# Patient Record
Sex: Male | Born: 1960
Health system: Southern US, Community
[De-identification: ages and names within clinical notes are randomized; demographics above are authoritative.]

## PROBLEM LIST (undated history)

## (undated) DIAGNOSIS — G4730 Sleep apnea, unspecified: Secondary | ICD-10-CM

## (undated) DIAGNOSIS — I10 Essential (primary) hypertension: Secondary | ICD-10-CM

## (undated) DIAGNOSIS — T7840XA Allergy, unspecified, initial encounter: Secondary | ICD-10-CM

## (undated) DIAGNOSIS — R030 Elevated blood-pressure reading, without diagnosis of hypertension: Secondary | ICD-10-CM

## (undated) HISTORY — DX: Elevated blood-pressure reading, without diagnosis of hypertension: R03.0

## (undated) HISTORY — DX: Essential (primary) hypertension: I10

## (undated) HISTORY — DX: Sleep apnea, unspecified: G47.30

## (undated) HISTORY — DX: Allergy, unspecified, initial encounter: T78.40XA

## (undated) HISTORY — PX: COLONOSCOPY: SHX174

---

## 2009-04-06 ENCOUNTER — Encounter: Admission: RE | Admit: 2009-04-06 | Discharge: 2009-04-06 | Payer: Self-pay | Admitting: Family Medicine

## 2009-05-11 ENCOUNTER — Ambulatory Visit: Payer: Self-pay

## 2009-05-16 ENCOUNTER — Encounter (INDEPENDENT_AMBULATORY_CARE_PROVIDER_SITE_OTHER): Payer: Self-pay | Admitting: Radiology

## 2009-05-24 ENCOUNTER — Ambulatory Visit (HOSPITAL_BASED_OUTPATIENT_CLINIC_OR_DEPARTMENT_OTHER): Admission: RE | Admit: 2009-05-24 | Discharge: 2009-05-24 | Payer: Self-pay | Admitting: Urology

## 2009-05-30 ENCOUNTER — Telehealth (INDEPENDENT_AMBULATORY_CARE_PROVIDER_SITE_OTHER): Payer: Self-pay | Admitting: *Deleted

## 2009-05-31 ENCOUNTER — Encounter (INDEPENDENT_AMBULATORY_CARE_PROVIDER_SITE_OTHER): Payer: Self-pay | Admitting: *Deleted

## 2009-05-31 ENCOUNTER — Ambulatory Visit: Payer: Self-pay | Admitting: Cardiovascular Disease

## 2009-05-31 ENCOUNTER — Ambulatory Visit (HOSPITAL_COMMUNITY): Admission: RE | Admit: 2009-05-31 | Discharge: 2009-05-31 | Payer: Self-pay | Admitting: Family Medicine

## 2009-05-31 ENCOUNTER — Ambulatory Visit: Payer: Self-pay

## 2009-05-31 ENCOUNTER — Encounter (HOSPITAL_COMMUNITY): Admission: RE | Admit: 2009-05-31 | Discharge: 2009-08-08 | Payer: Self-pay | Admitting: Family Medicine

## 2009-05-31 ENCOUNTER — Encounter: Payer: Self-pay | Admitting: Family Medicine

## 2010-06-04 HISTORY — PX: VASECTOMY: SHX75

## 2011-04-16 IMAGING — CT CT ABDOMEN W/O CM
2 of 4 series · 17 of 46 positions shown, 19 images · non-contrast
Comparison: None

CT ABDOMEN

CLINICAL DATA: Flank pain and hematuria

CT ABDOMEN AND PELVIS WITHOUT CONTRAST
TECHNIQUE: Multidetector CT imaging of the abdomen and pelvis was
performed following the standard protocol without intravenous
contrast.

[Series 6: wo · axial · 0.84mm/px · z∈[+5,+465]mm · 14 of 101 slices shown, 16 images]
[im 5/101  soft-tissue]
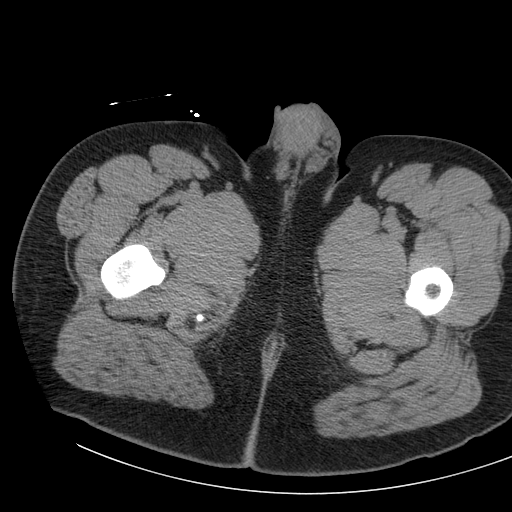
[im 5/101  bone]
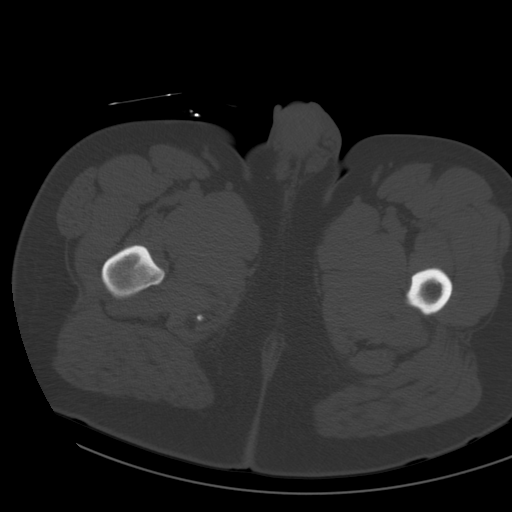
[im 13/101  soft-tissue]
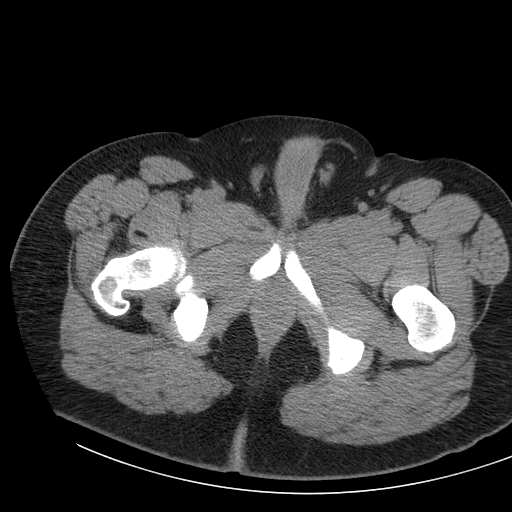
[im 21/101  soft-tissue]
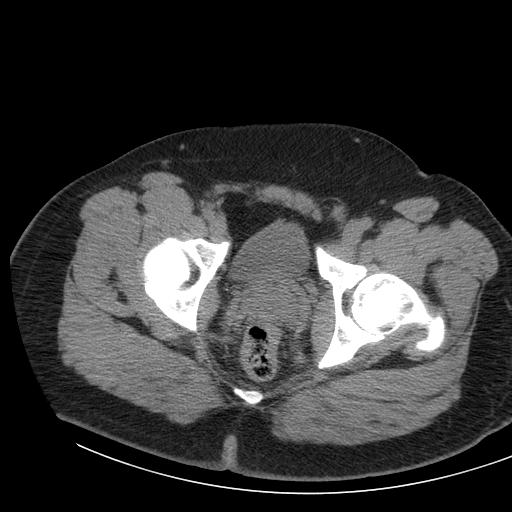
[im 29/101  soft-tissue]
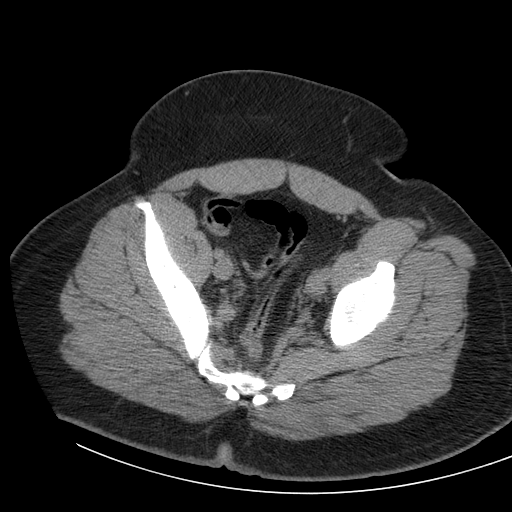
[im 33/101  soft-tissue]
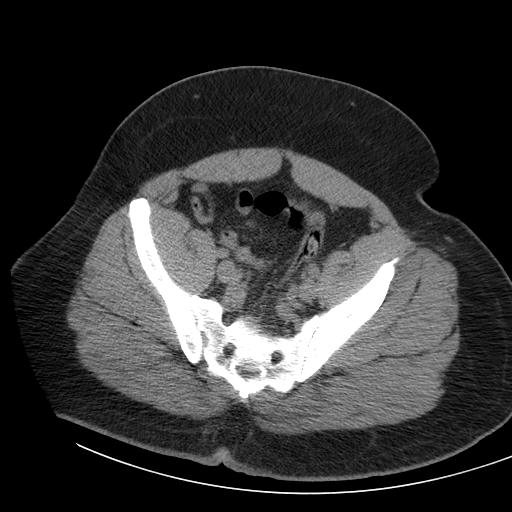
[im 41/101  soft-tissue]
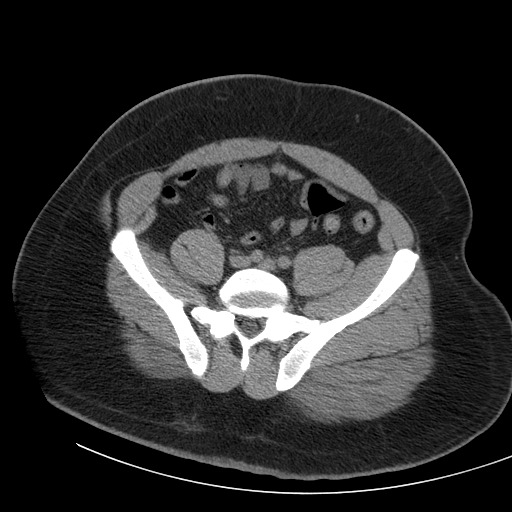
[im 49/101  soft-tissue]
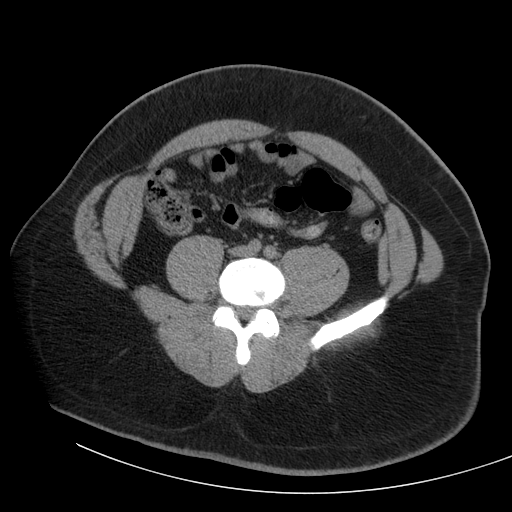
[im 53/101  soft-tissue]
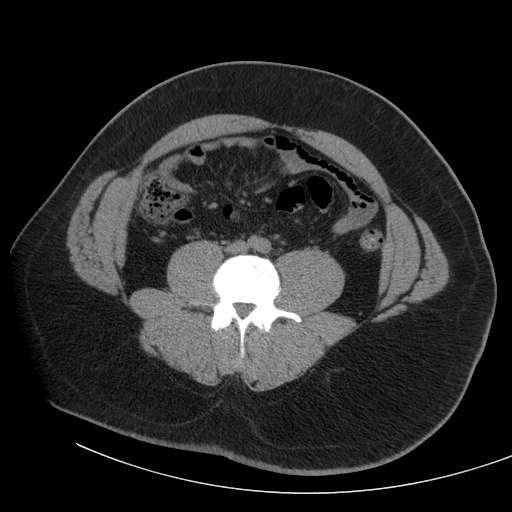
[im 61/101  soft-tissue]
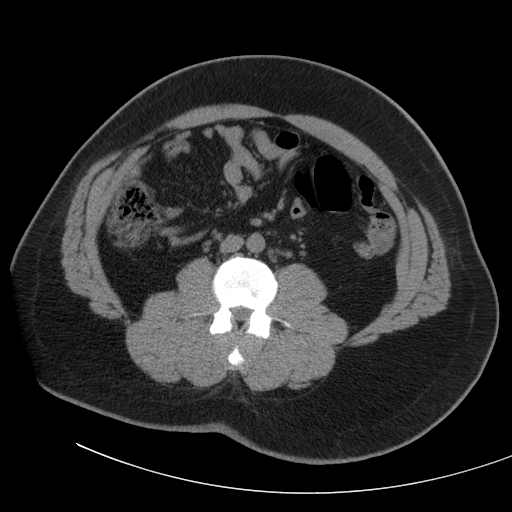
[im 61/101  bone]
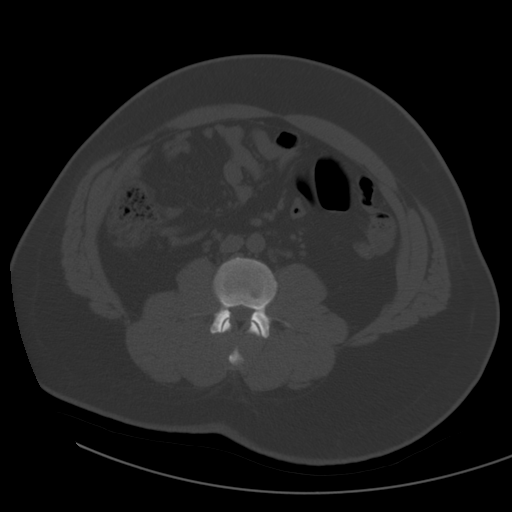
[im 69/101  soft-tissue]
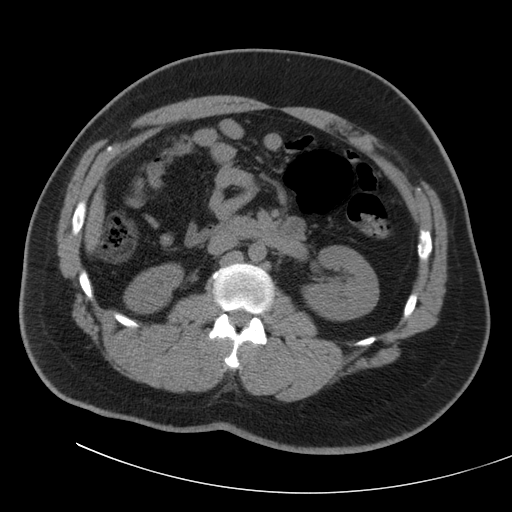
[im 77/101  soft-tissue]
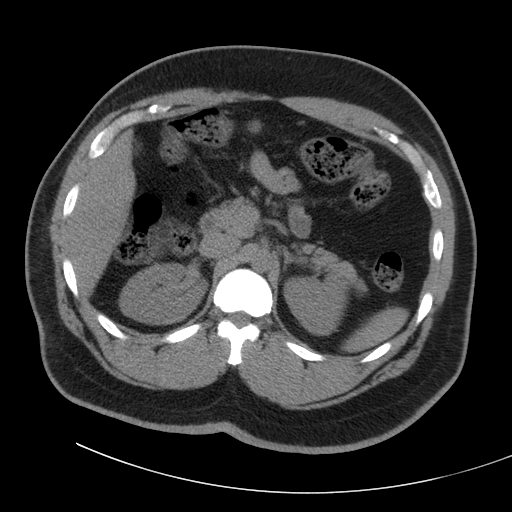
[im 81/101  soft-tissue]
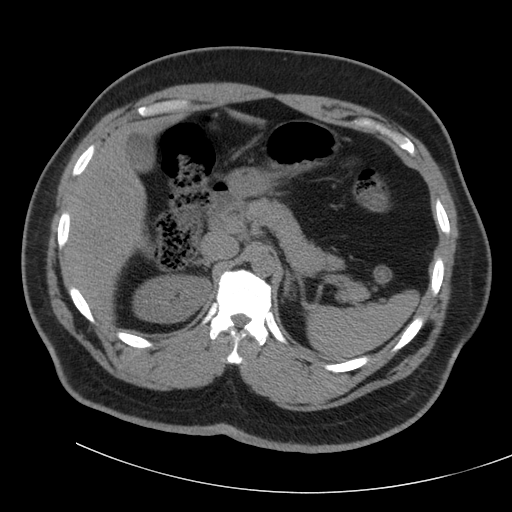
[im 89/101  soft-tissue]
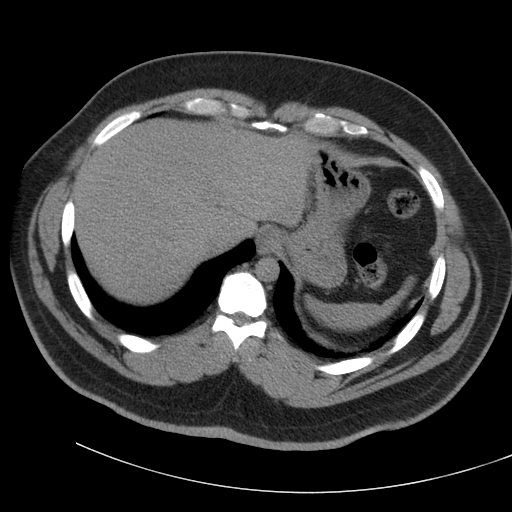
[im 97/101  soft-tissue]
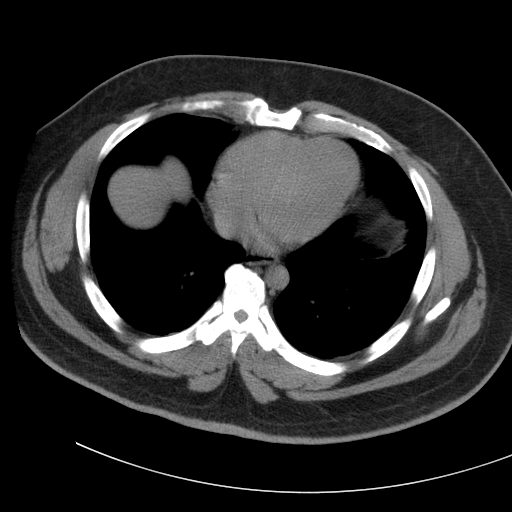

[coronals · coronal · 0.97mm/px · 3 of 116 slices shown]
[im 39/116  soft-tissue]
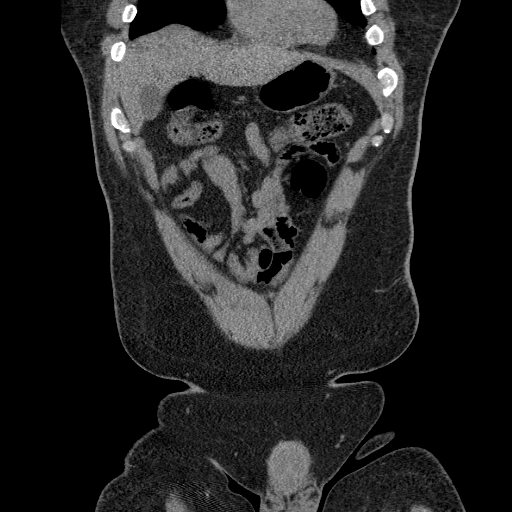
[im 52/116  soft-tissue]
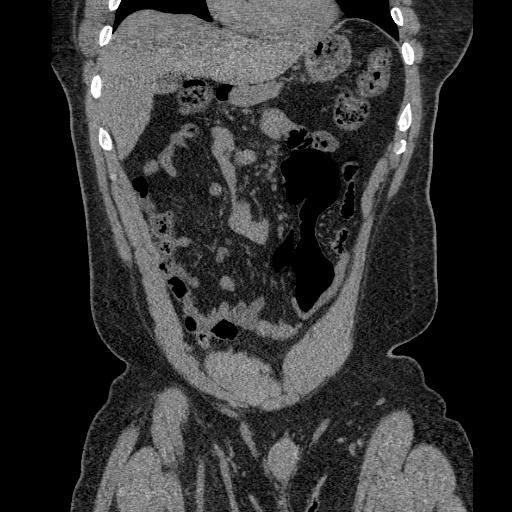
[im 64/116  soft-tissue]
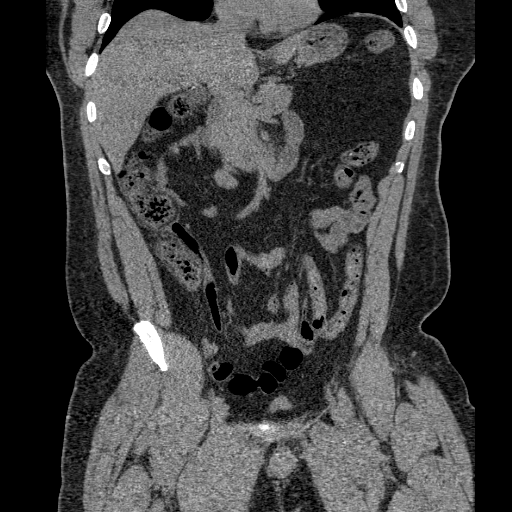

[17 of 46 positions shown; findings below may reference images not displayed]

FINDINGS: The lung bases are clear. The spleen is normal.

Both adrenal glands are normal.

The pancreas is normal.

There are several small stones versus sludge within the dependent
portion of the gallbladder.

The liver appears normal.

There is no free fluid or abnormal fluid collection.

The bowel loops of the upper abdomen have a normal course and
caliber without obstruction.

There is no evidence for nephrolithiasis or hydronephrosis.

No hydroureter identified.

No enlarged lymph nodes identified within the upper abdomen.

The bowel loops are normal in their course and caliber.

There are no free fluid or abnormal fluid collections.

Review of the visualized osseous structures shows mild spondylosis.
IMPRESSION: 1.  No acute upper abdominal CT findings.  No evidence for
nephrolithiasis or obstructive uropathy.

CT PELVIS
FINDINGS: Appendix identified and normal.

Visualized colon and small bowel are unremarkable.

No free fluid or abnormal fluid collections.

No significant lymphadenopathy.

Urinary bladder is normal.
IMPRESSION: 1.  No acute pelvic CT findings.
2.  No bladder calculi or distal ureteral calculi.

## 2011-05-09 ENCOUNTER — Ambulatory Visit (INDEPENDENT_AMBULATORY_CARE_PROVIDER_SITE_OTHER): Payer: 59

## 2011-05-09 DIAGNOSIS — Z0000 Encounter for general adult medical examination without abnormal findings: Secondary | ICD-10-CM

## 2011-05-09 DIAGNOSIS — R030 Elevated blood-pressure reading, without diagnosis of hypertension: Secondary | ICD-10-CM

## 2011-05-09 DIAGNOSIS — G4700 Insomnia, unspecified: Secondary | ICD-10-CM

## 2012-11-14 ENCOUNTER — Ambulatory Visit (INDEPENDENT_AMBULATORY_CARE_PROVIDER_SITE_OTHER): Payer: 59 | Admitting: Family Medicine

## 2012-11-14 ENCOUNTER — Encounter: Payer: Self-pay | Admitting: Family Medicine

## 2012-11-14 VITALS — BP 132/102 | HR 76 | Temp 98.3°F | Resp 16 | Ht 71.0 in | Wt 301.0 lb

## 2012-11-14 DIAGNOSIS — N400 Enlarged prostate without lower urinary tract symptoms: Secondary | ICD-10-CM

## 2012-11-14 DIAGNOSIS — I10 Essential (primary) hypertension: Secondary | ICD-10-CM

## 2012-11-14 DIAGNOSIS — G479 Sleep disorder, unspecified: Secondary | ICD-10-CM

## 2012-11-14 DIAGNOSIS — Z0000 Encounter for general adult medical examination without abnormal findings: Secondary | ICD-10-CM

## 2012-11-14 DIAGNOSIS — I159 Secondary hypertension, unspecified: Secondary | ICD-10-CM | POA: Insufficient documentation

## 2012-11-14 DIAGNOSIS — Z1211 Encounter for screening for malignant neoplasm of colon: Secondary | ICD-10-CM

## 2012-11-14 LAB — LIPID PANEL
Cholesterol: 154 mg/dL (ref 0–200)
HDL: 40 mg/dL (ref >39)
Total CHOL/HDL Ratio: 3.9 Ratio
Triglycerides: 76 mg/dL (ref <150)

## 2012-11-14 LAB — POCT URINALYSIS DIPSTICK
Protein, UA: NEGATIVE
Spec Grav, UA: >=1.03
Urobilinogen, UA: 1

## 2012-11-14 LAB — CBC WITH DIFFERENTIAL/PLATELET
Basophils Absolute: 0 10*3/uL (ref 0.0–0.1)
Basophils Relative: 0 % (ref 0–1)
Eosinophils Relative: 4 % (ref 0–5)
HCT: 49.1 % (ref 39.0–52.0)
MCH: 31.7 pg (ref 26.0–34.0)
MCHC: 34.8 g/dL (ref 30.0–36.0)
MCV: 90.9 fL (ref 78.0–100.0)
Monocytes Absolute: 0.5 10*3/uL (ref 0.1–1.0)
RDW: 12.9 % (ref 11.5–15.5)

## 2012-11-14 LAB — COMPREHENSIVE METABOLIC PANEL
AST: 18 U/L (ref 0–37)
BUN: 12 mg/dL (ref 6–23)
CO2: 24 mEq/L (ref 19–32)
Calcium: 9.7 mg/dL (ref 8.4–10.5)
Chloride: 103 mEq/L (ref 96–112)
Creat: 1.18 mg/dL (ref 0.50–1.35)

## 2012-11-14 LAB — IFOBT (OCCULT BLOOD): IFOBT: NEGATIVE

## 2012-11-14 MED ORDER — DOXAZOSIN MESYLATE 2 MG PO TABS: 2.0000 mg | ORAL_TABLET | Freq: Every day | ORAL | Status: DC

## 2012-11-14 NOTE — Progress Notes (Signed)
Subjective:    Patient ID: Steven Simpson, male    DOB: 07-26-1960, 52 y.o.   MRN: [id number]  HPI  This 52 y.o. AA male is here for CPE; last exam Dec 2012. Noted to have chronic mildly BP  but no history of treatment. He also has chronically swollen ankles; work-up has been negative.  Pt reports "heart racing" with diuretics. Cardiology Nuclear study at [facility] in  Dec 2010- negative.    Pt does report some sleep disturbance where he awakens feeling "claustrophobic" if he is  not positioned a certain way. He has been told by his wife that he snores. He denies daytime  sleepiness or excessive fatigue.   PMHx, Soc Hx and Fam Hx reviewed.   Review of Systems  Constitutional: Negative.   HENT: Negative.   Eyes: Negative.   Respiratory: Negative.   Cardiovascular: Positive for leg swelling.  Gastrointestinal: Negative.   Endocrine: Negative.   Genitourinary: Negative.   Musculoskeletal: Negative.   Skin: Negative.   Allergic/Immunologic: Negative.   Neurological: Negative.   Hematological: Negative.   Psychiatric/Behavioral: Negative.        Objective:   Physical Exam  Nursing note and vitals reviewed. Constitutional: He is oriented to person, place, and time. He appears well-developed and well-nourished. No distress.  HENT:  Head: Normocephalic and atraumatic.  Right Ear: Hearing, external ear and ear canal normal. Tympanic membrane is scarred.  Left Ear: Hearing, external ear and ear canal normal. Tympanic membrane is scarred.  Nose: Nose normal. No mucosal edema, nasal deformity or septal deviation.  Mouth/Throat: Uvula is midline, oropharynx is clear and moist and mucous membranes are normal. No oral lesions. Normal dentition. No dental caries.  Posterior pharynx- Mallampati Class III  Neck: Full passive range of motion without pain. Neck supple. No hepatojugular reflux and no JVD present. No spinous process tenderness and no muscular tenderness present.  Normal range of motion present. No mass and no thyromegaly present.  Neck circumference > normal.  Cardiovascular: Normal rate, regular rhythm, normal heart sounds and intact distal pulses.  Exam reveals no gallop and no friction rub.   No murmur heard. Pulmonary/Chest: Effort normal and breath sounds normal. No respiratory distress. He has no wheezes. He has no rales. He exhibits no tenderness.  Abdominal: Soft. Normal appearance and bowel sounds are normal. He exhibits no distension, no pulsatile midline mass and no mass. There is no hepatosplenomegaly. There is no tenderness. There is no guarding and no CVA tenderness. No hernia.  Genitourinary: Rectum normal. Rectal exam shows no external hemorrhoid, no fissure, no mass, no tenderness and anal tone normal. Guaiac negative stool. Prostate is enlarged. Prostate is not tender.  Musculoskeletal: Normal range of motion. He exhibits edema. He exhibits no tenderness.  Lymphadenopathy:       Right: No inguinal adenopathy present.       Left: No inguinal adenopathy present.  Neurological: He is alert and oriented to person, place, and time. He has normal strength. He is not disoriented. He displays no atrophy. No cranial nerve deficit or sensory deficit. He exhibits normal muscle tone. He displays a negative Romberg sign. Coordination and gait normal.  Reflex Scores:      Tricep reflexes are 1+ on the right side and 1+ on the left side.      Bicep reflexes are 1+ on the right side and 1+ on the left side.      Patellar reflexes are 1+ on the right side and 1+ on  the left side. Skin: Skin is warm and dry. No rash noted. No erythema.  Large sessile skin tag in mid back.  Psychiatric: He has a normal mood and affect. His behavior is normal. Judgment and thought content normal.      Results for orders placed in visit on 11/14/12  POCT URINALYSIS DIPSTICK      Result Value Range   Color, UA yellow     Clarity, UA clear     Glucose, UA neg      Bilirubin, UA neg     Ketones, UA neg     Spec Grav, UA >=1.030     Blood, UA moderate     pH, UA 6.0     Protein, UA neg     Urobilinogen, UA 1.0     Nitrite, UA neg     Leukocytes, UA Negative    IFOBT (OCCULT BLOOD)      Result Value Range   IFOBT Negative          Assessment & Plan:  Routine general medical examination at a health care facility - Plan: POCT urinalysis dipstick, IFOBT POC (occult bld, rslt in office), Comprehensive metabolic panel, Lipid panel, CBC with Differential, EKG 12-Lead  Unspecified essential hypertension - Plan: Vitamin D 25 hydroxy, Comprehensive metabolic panel, Lipid panel, EKG 12-Lead                                                            RX: Doxazosin 2 mg  Start with 1/2 tab hs for 3 nights then 1 tab hs.   Sleep disturbance - Plan: Ambulatory referral to Sleep Studies  Hypertrophy of prostate without urinary obstruction and other lower urinary tract symptoms (LUTS) -  Py has had eval at [facility]  ~ 3 years ago.   Plan: PSA  Severe obesity (BMI >= 40)- advised weight reduction with healthy eating and increased activity level.  Screening for colorectal cancer - Plan: Ambulatory referral to Gastroenterology  Meds ordered this encounter  Medications  . doxazosin (CARDURA) 2 MG tablet    Sig: Take 1 tablet (2 mg total) by mouth at bedtime.    Dispense:  30 tablet    Refill:  5

## 2012-11-14 NOTE — Patient Instructions (Signed)
Keeping you healthy  Get these tests  Blood pressure- Have your blood pressure checked once a year by your healthcare provider.  Normal blood pressure is 120/80  Weight- Have your body mass index (BMI) calculated to screen for obesity.  BMI is a measure of body fat based on height and weight. You can also calculate your own BMI at [email or web address].  Cholesterol- Have your cholesterol checked every year.  Diabetes- Have your blood sugar checked regularly if you have high blood pressure, high cholesterol, have a family history of diabetes or if you are overweight.  Screening for Colon Cancer- Colonoscopy starting at age 95.  Screening may begin sooner depending on your family history and other health conditions. Follow up colonoscopy as directed by your Gastroenterologist- referred to Dr. Elnoria Howard.  Screening for Prostate Cancer- Both blood work (PSA) and a rectal exam help screen for Prostate Cancer.  Screening begins at age 36 with African-American men and at age 35 with Caucasian men.  Screening may begin sooner depending on your family history. You still have blood showing up in your urine specimen. I will continue to monitor this.  Take these medicines  Aspirin- One aspirin daily can help prevent Heart disease and Stroke.  Flu shot- Every fall.  Tetanus- Every 10 years. Next Tetanus due in 2019.  Zostavax- Once after the age of 66 to prevent Shingles.  Pneumonia shot- Once after the age of 2; if you are younger than 2, ask your healthcare provider if you need a Pneumonia shot.  Take these steps  Don't smoke- If you do smoke, talk to your doctor about quitting.  For tips on how to quit, go to www.smokefree.gov or call 1-800-QUIT-NOW.  Be physically active- Exercise 5 days a week for at least 30 minutes.  If you are not already physically active start slow and gradually work up to 30 minutes of moderate physical activity.  Examples of moderate activity include walking briskly,  mowing the yard, dancing, swimming, bicycling, etc.  Eat a healthy diet- Eat a variety of healthy food such as fruits, vegetables, low fat milk, low fat cheese, yogurt, lean meant, poultry, fish, beans, tofu, etc. For more information go to www.thenutritionsource.org  Drink alcohol in moderation- Limit alcohol intake to less than two drinks a day. Never drink and drive.  Dentist- Brush and floss twice daily; visit your dentist twice a year.  Depression- Your emotional health is as important as your physical health. If you're feeling down, or losing interest in things you would normally enjoy please talk to your healthcare provider.  Eye exam- Visit your eye doctor every year.  Safe sex- If you may be exposed to a sexually transmitted infection, use a condom.  Seat belts- Seat belts can save your life; always wear one.  Smoke/Carbon Monoxide detectors- These detectors need to be installed on the appropriate level of your home.  Replace batteries at least once a year.  Skin cancer- When out in the sun, cover up and use sunscreen 15 SPF or higher.  Violence- If anyone is threatening you, please tell your healthcare provider.  Living Will/ Health care power of attorney- Speak with your healthcare provider and family.   You have Hypertension; I have prescribed a medication- "Cardura" is the brand but you will get a generic at your pharmacy. Start by taking 1/2 tablet at bedtime through the weekend then, starting Monday night, take 1 tablet at bedtime.  Hypertension Information As your heart beats, it forces blood through your  arteries. This force is your blood pressure. If the pressure is too high, it is called hypertension (HTN) or high blood pressure. HTN is dangerous because you may have it and not know it. High blood pressure may mean that your heart has to work harder to pump blood. Your arteries may be narrow or stiff. The extra work puts you at risk for heart disease, stroke, and other  problems.  Blood pressure consists of two numbers, a higher number over a lower, 110/72, for example. It is stated as "110 over 72." The ideal is below 120 for the top number (systolic) and under 80 for the bottom (diastolic).  You should pay close attention to your blood pressure if you have certain conditions such as:  Heart failure.   Prior heart attack.   Diabetes   Chronic kidney disease.   Prior stroke.   Multiple risk factors for heart disease.  To see if you have HTN, your blood pressure should be measured while you are seated with your arm held at the level of the heart. It should be measured at least twice. A one-time elevated blood pressure reading (especially in the Emergency Department) does not mean that you need treatment. There may be conditions in which the blood pressure is different between your right and left arms. It is important to see your caregiver soon for a recheck. Most people have essential hypertension which means that there is not a specific cause. This type of high blood pressure may be lowered by changing lifestyle factors such as:  Stress.   Smoking.   Lack of exercise.   Excessive weight.   Drug/tobacco/alcohol use.   Eating less salt.  Most people do not have symptoms from high blood pressure until it has caused damage to the body. Effective treatment can often prevent, delay or reduce that damage. TREATMENT  Treatment for high blood pressure, when a cause has been identified, is directed at the cause. There are a large number of medications to treat HTN. These fall into several categories, and your caregiver will help you select the medicines that are best for you. Medications may have side effects. You should review side effects with your caregiver. If your blood pressure stays high after you have made lifestyle changes or started on medicines,   Your medication(s) may need to be changed.   Other problems may need to be addressed.   Be  certain you understand your prescriptions, and know how and when to take your medicine.   Be sure to follow up with your caregiver within the time frame advised (usually within two weeks) to have your blood pressure rechecked and to review your medications.   If you are taking more than one medicine to lower your blood pressure, make sure you know how and at what times they should be taken. Taking two medicines at the same time can result in blood pressure that is too low.  Document Released: 07/24/2005 Document Revised: 01/31/2011 Document Reviewed: 07/31/2007 [facility] Patient Information 2012 [place], [place].

## 2012-11-16 ENCOUNTER — Encounter: Payer: Self-pay | Admitting: Family Medicine

## 2012-12-12 ENCOUNTER — Ambulatory Visit: Payer: 59 | Admitting: Family Medicine

## 2012-12-17 ENCOUNTER — Ambulatory Visit: Payer: 59 | Admitting: Family Medicine

## 2012-12-31 ENCOUNTER — Encounter: Payer: Self-pay | Admitting: Family Medicine

## 2012-12-31 ENCOUNTER — Ambulatory Visit (INDEPENDENT_AMBULATORY_CARE_PROVIDER_SITE_OTHER): Payer: 59 | Admitting: Family Medicine

## 2012-12-31 VITALS — BP 142/80 | HR 109 | Temp 98.6°F | Resp 16 | Ht 70.75 in | Wt 303.0 lb

## 2012-12-31 DIAGNOSIS — E669 Obesity, unspecified: Secondary | ICD-10-CM

## 2012-12-31 DIAGNOSIS — T50905A Adverse effect of unspecified drugs, medicaments and biological substances, initial encounter: Secondary | ICD-10-CM

## 2012-12-31 DIAGNOSIS — I10 Essential (primary) hypertension: Secondary | ICD-10-CM

## 2012-12-31 DIAGNOSIS — T887XXA Unspecified adverse effect of drug or medicament, initial encounter: Secondary | ICD-10-CM

## 2012-12-31 MED ORDER — ATENOLOL-CHLORTHALIDONE 50-25 MG PO TABS: 1.0000 | ORAL_TABLET | Freq: Every day | ORAL | Status: DC

## 2012-12-31 NOTE — Patient Instructions (Addendum)
Calorie Counting Diet A calorie counting diet requires you to eat the number of calories that are right for you in a day. Calories are the measurement of how much energy you get from the food you eat. Eating the right amount of calories is important for staying at a healthy weight. If you eat too many calories, your body will store them as fat and you may gain weight. If you eat too few calories, you may lose weight. Counting the number of calories you eat during a day will help you know if you are eating the right amount. A Registered Dietitian can determine how many calories you need in a day. The amount of calories needed varies from person to person. If your goal is to lose weight, you will need to eat fewer calories. Losing weight can benefit you if you are overweight or have health problems such as heart disease, high blood pressure, or diabetes. If your goal is to gain weight, you will need to eat more calories. Gaining weight may be necessary if you have a certain health problem that causes your body to need more energy. TIPS Whether you are increasing or decreasing the number of calories you eat during a day, it may be hard to get used to changes in what you eat and drink. The following are tips to help you keep track of the number of calories you eat.  Measure foods at home with measuring cups. This helps you know the amount of food and number of calories you are eating.  Restaurants often serve food in amounts that are larger than 1 serving. While eating out, estimate how many servings of a food you are given. For example, a serving of cooked rice is  cup or about the size of half of a fist. Knowing serving sizes will help you be aware of how much food you are eating at restaurants.  Ask for smaller portion sizes or child-size portions at restaurants.  Plan to eat half of a meal at a restaurant. Take the rest home or share the other half with a friend.  Read the Nutrition Facts panel on  food labels for calorie content and serving size. You can find out how many servings are in a package, the size of a serving, and the number of calories each serving has.  For example, a package might contain 3 cookies. The Nutrition Facts panel on that package says that 1 serving is 1 cookie. Below that, it will say there are 3 servings in the container. The calories section of the Nutrition Facts label says there are 90 calories. This means there are 90 calories in 1 cookie (1 serving). If you eat 1 cookie you have eaten 90 calories. If you eat all 3 cookies, you have eaten 270 calories (3 servings x 90 calories = 270 calories). The list below tells you how big or small some common portion sizes are.  1 oz.........4 stacked dice.  3 oz.........Deck of cards.  1 tsp........Tip of little finger.  1 tbs........Thumb.  2 tbs........Golf ball.   cup.......Half of a fist.  1 cup........A fist. KEEP A FOOD LOG Write down every food item you eat, the amount you eat, and the number of calories in each food you eat during the day. At the end of the day, you can add up the total number of calories you have eaten. It may help to keep a list like the one below. Find out the calorie information by reading the   Nutrition Facts panel on food labels. Breakfast  Bran cereal (1 cup, 110 calories).  Fat-free milk ( cup, 45 calories). Snack  Apple (1 medium, 80 calories). Lunch  Spinach (1 cup, 20 calories).  Tomato ( medium, 20 calories).  Chicken breast strips (3 oz, 165 calories).  Shredded cheddar cheese ( cup, 110 calories).  Light Italian dressing (2 tbs, 60 calories).  Whole-wheat bread (1 slice, 80 calories).  Tub margarine (1 tsp, 35 calories).  Vegetable soup (1 cup, 160 calories). Dinner  Pork chop (3 oz, 190 calories).  Brown rice (1 cup, 215 calories).  Steamed broccoli ( cup, 20 calories).  Strawberries (1  cup, 65 calories).  Whipped cream (1 tbs, 50  calories). Daily Calorie Total: 1425 Document Released: 05/21/2005 Document Revised: 08/13/2011 Document Reviewed: 11/15/2006 [employer] Patient Information 2014 [employer].  Exercise to Lose Weight Exercise and a healthy diet may help you lose weight. Your doctor may suggest specific exercises. EXERCISE IDEAS AND TIPS  Choose low-cost things you enjoy doing, such as walking, bicycling, or exercising to workout videos.  Take stairs instead of the elevator.  Walk during your lunch break.  Park your car further away from work or school.  Go to a gym or an exercise class.  Start with 5 to 10 minutes of exercise each day. Build up to 30 minutes of exercise 4 to 6 days a week.  Wear shoes with good support and comfortable clothes.  Stretch before and after working out.  Work out until you breathe harder and your heart beats faster.  Drink extra water when you exercise.  Do not do so much that you hurt yourself, feel dizzy, or get very short of breath. Exercises that burn about 150 calories:  Running 1  miles in 15 minutes.  Playing volleyball for 45 to 60 minutes.  Washing and waxing a car for 45 to 60 minutes.  Playing touch football for 45 minutes.  Walking 1  miles in 35 minutes.  Pushing a stroller 1  miles in 30 minutes.  Playing basketball for 30 minutes.  Raking leaves for 30 minutes.  Bicycling 5 miles in 30 minutes.  Walking 2 miles in 30 minutes.  Dancing for 30 minutes.  Shoveling snow for 15 minutes.  Swimming laps for 20 minutes.  Walking up stairs for 15 minutes.  Bicycling 4 miles in 15 minutes.  Gardening for 30 to 45 minutes.  Jumping rope for 15 minutes.  Washing windows or floors for 45 to 60 minutes. Document Released: 06/23/2010 Document Revised: 08/13/2011 Document Reviewed: 06/23/2010 [employer] Patient Information 2014 [employer].   

## 2012-12-31 NOTE — Progress Notes (Signed)
S:  This 52 y.o. AA male has HTN and obesity. He is compliant with medication but c/o "jitteriness" every AM after taking the medication at bedtime. He has not addressed weight issue. Pt denies fatigue, diaphoresis, CP or tightness, edema, SOB or DOE, GI problems, HA, dizziness, numbness or weakness or syncope.    Patient Active Problem List   Diagnosis Date Noted  . Unspecified essential hypertension 11/14/2012  . Hypertrophy of prostate without urinary obstruction and other lower urinary tract symptoms (LUTS) 11/14/2012  . Severe obesity (BMI >= 40) 11/14/2012    PMHx, Soc Hx and Fam Hx reviewed.  ROS: As per HPI.  O:  Filed Vitals:   12/31/12 0845  BP: 142/80  Pulse: 109  Temp: 98.6 F (37 C)  Resp: 16   GEN: In NAD; WN,WD. Pt is obese. HENT: [place]/AT; EOMI w/ clear conj/sclerae. Otherwise benign. NECK: Mild TMG; no nodules or asymmetry noted. COR: Sinus tachycardia; no m/g/r. LUNGS: Normal resp rate or effort. SKIN: W&D. NEURO: A&O x 3; CNs intact. DTRs: difficult to illicit. Gait normal. Otherwise unremarkable.  A/P: HTN (hypertension)- Stable and improved but pt having medication SE.  Medication side effect, initial encounter- D/C Doxazosin. RX: Atenolol- Chlorthalidone 50-25 mg 1 tab every AM.  Obesity, unspecified- Advised weight loss.  RTC in 6 weeks for BP recheck. Pt has Vit D def and advised to get OTC Vit D3 2000 IU and take it daily.

## 2013-01-02 ENCOUNTER — Encounter: Payer: Self-pay | Admitting: Neurology

## 2013-01-02 ENCOUNTER — Ambulatory Visit (INDEPENDENT_AMBULATORY_CARE_PROVIDER_SITE_OTHER): Payer: 59 | Admitting: Neurology

## 2013-01-02 VITALS — BP 126/82 | HR 81 | Temp 98.7°F | Ht 70.0 in | Wt 304.0 lb

## 2013-01-02 DIAGNOSIS — G4733 Obstructive sleep apnea (adult) (pediatric): Secondary | ICD-10-CM

## 2013-01-02 NOTE — Patient Instructions (Signed)

## 2013-01-02 NOTE — Progress Notes (Signed)
Subjective:    Patient ID: Steven Simpson is a 52 y.o. male.  Neurologic Problem   Steven Foley, MD, PhD [facility] [address] [address] [place], [place] [address]  Dear Dr. Audria Nine,   I saw your patient, Steven Simpson, upon your kind request in my neurologic clinic today for initial consultation of his sleep disorder, in particular concerning for obstructive sleep apnea. The patient is unaccompanied today. As you know, Steven Simpson is a very friendly 52 year old Simpson-handed gentleman with an underlying medical history of hypertension, prostate problems, and obesity, who reports snoring and waking up with a feeling of claustrophobia. He has noted chronically swollen ankles.  His typical bedtime is reported to be around 10:30 or 11 PM and usual wake time is around 6 or 6:30 AM. Sleep onset typically occurs within a few minutes, but he does not stay asleep. He reports feeling not feeling rested upon awakening. He wakes up on an average 3 to 4 times in the middle of the night and has to go to the bathroom 2 times on a typical night. He admits to rare morning headaches.  He reports excessive daytime somnolence (EDS) and His Epworth Sleepiness Score (ESS) is 6/24 today. He has not fallen asleep while driving. The patient has not been taking a planned nap, but occasionally naps on the weekends. He denies dreaming in a nap and reports feeling refreshed after a nap.  He has been known to snore for the past few years. Snoring is reportedly moderate, and associated with choking sounds and witnessed apneas. The patient admits to a sense of choking or strangling feeling. There is no report of nighttime reflux, with no nighttime cough experienced. The patient has not noted any RLS symptoms and is not known to kick while asleep or before falling asleep. He is a restless sleeper and in the morning, the bed is quite disheveled.   He denies cataplexy, sleep paralysis,  hypnagogic or hypnopompic hallucinations, or sleep attacks. He does not report any vivid dreams, nightmares, dream enactments, or parasomnias, such as sleep talking or sleep walking. The patient has not had a sleep study or a home sleep test.  He consumes 0 caffeinated beverage per day.   His Past Medical History Is Significant For: Past Medical History  Diagnosis Date  . Pre-hypertension     His Past Surgical History Is Significant For: Past Surgical History  Procedure Laterality Date  . Vasectomy  2012    His Family History Is Significant For: Family History  Problem Relation Age of Onset  . Dementia Mother     early stage  . Heart disease Father   . Cancer Sister     throat  . Diabetes Brother     diabetes type 1    His Social History Is Significant For: History   Social History  . Marital Status: Married    Spouse Name: Steven Simpson    Number of Children: 2  . Years of Education: MA   Occupational History  . COMPUTER ANALYST [employer]  .  [employer]   Social History Main Topics  . Smoking status: Never Smoker   . Smokeless tobacco: Never Used  . Alcohol Use: No  . Drug Use: No  . Sexually Active: Yes     Comment: number of sex partners in the last 12 months 1    [name]   Other Topics Concern  . None   Social History Narrative   Patient  lives at home with spouse.   Caffeine Use: 1 soda weekly    His Allergies Are:  No Known Allergies:   His Current Medications Are:  Outpatient Encounter Prescriptions as of 01/02/2013  Medication Sig Dispense Refill  . atenolol-chlorthalidone (TENORETIC) 50-25 MG per tablet Take 1 tablet by mouth daily.  30 tablet  5  . doxazosin (CARDURA) 2 MG tablet Take 1 tablet by mouth daily.       No facility-administered encounter medications on file as of 01/02/2013.   Review of Systems  Constitutional:       Insomnia   Respiratory:       Snoring   Psychiatric/Behavioral: Positive for sleep disturbance.     Objective:  Neurologic Exam  Physical Exam Physical Examination:   Filed Vitals:   01/02/13 0853  BP: 126/82  Pulse: 81  Temp: 98.7 F (37.1 C)    General Examination: The patient is a very pleasant 52 y.o. male in no acute distress. He appears well-developed and well-nourished and well groomed. He is obese.   HEENT: Normocephalic, atraumatic, pupils are equal, round and reactive to light and accommodation. Extraocular tracking is good without limitation to gaze excursion or nystagmus noted. Normal smooth pursuit is noted. Hearing is grossly intact. Tympanic membranes are clear bilaterally. Face is symmetric with normal facial animation and normal facial sensation. Speech is clear with no dysarthria noted. There is no hypophonia. There is no lip, neck/head, jaw or voice tremor. Neck is supple with full range of passive and active motion. There are no carotid bruits on auscultation. Oropharynx exam reveals: mild mouth dryness, adequate dental hygiene and moderate airway crowding, due to larger tongue, thick soft palate. Mallampati is class II. Tongue protrudes centrally and palate elevates symmetrically. Tonsils are small. Neck size is 19.5 inches.   Chest: Clear to auscultation without wheezing, rhonchi or crackles noted.  Heart: S1+S2+0, regular and normal without murmurs, rubs or gallops noted.   Abdomen: Soft, non-tender and non-distended with normal bowel sounds appreciated on auscultation.  Extremities: There is 1+ pitting edema in the distal lower extremities bilaterally. Pedal pulses are intact.  Skin: Warm and dry without trophic changes noted. There are no varicose veins.  Musculoskeletal: exam reveals no obvious joint deformities, tenderness or joint swelling or erythema.   Neurologically:  Mental status: The patient is awake, alert and oriented in all 4 spheres. His memory, attention, language and knowledge are appropriate. There is no aphasia, agnosia, apraxia or  anomia. Speech is clear with normal prosody and enunciation. Thought process is linear. Mood is congruent and affect is normal.  Cranial nerves are as described above under HEENT exam. In addition, shoulder shrug is normal with equal shoulder height noted. Motor exam: Normal bulk, strength and tone is noted. There is no drift, tremor or rebound. Romberg is negative. Reflexes are 2+ throughout. Toes are downgoing bilaterally. Fine motor skills are intact with normal finger taps, normal hand movements, normal rapid alternating patting, normal foot taps and normal foot agility.  Cerebellar testing shows no dysmetria or intention tremor on finger to nose testing. Heel to shin is unremarkable bilaterally. There is no truncal or gait ataxia.  Sensory exam is intact to light touch, pinprick, vibration, temperature sense and proprioception in the upper and lower extremities.  Gait, station and balance are unremarkable. No veering to one side is noted. No leaning to one side is noted. Posture is age-appropriate and stance is narrow based. No problems turning are noted. He turns en  bloc. Tandem walk is unremarkable. Intact toe and heel stance is noted.               Assessment and Plan:   In summary, Taran Haynesworth is a very pleasant 52 y.o.-year old male with a history and physical exam concerning for obstructive sleep apnea (OSA). I had a long chat with the patient about my findings and the diagnosis, its prognosis and treatment options. We talked about medical treatments and non-pharmacological approaches. I explained in particular the risks and ramifications of untreated moderate to severe OSA, especially with respect to developing cardiovascular disease down the Road, including congestive heart failure, difficult to treat hypertension, cardiac arrhythmias, or stroke. Even type 2 diabetes has in part been linked to untreated OSA. We talked about trying to maintain a healthy lifestyle in general, as well as the  importance of weight control. I encouraged the patient to eat healthy, exercise daily and keep well hydrated, to keep a scheduled bedtime and wake time routine, to not skip any meals and eat healthy snacks in between meals.  I recommended the following at this time: sleep study with potential CPAP titration. The patient is a little apprehensive about the test and worried about not being able to sleep. He was re-assured in that regard. I explained the sleep test procedure to the patient and also outlined surgical and non-surgical treatment options of OSA including the use of a dental custom-made appliance, upper airway surgery such as pillar implants, radiofrequency surgery, tongue base surgery, and UPPP. I also explained the CPAP treatment option to the patient, who indicated that he would be willing to try CPAP if the need arises. I explained the importance of being compliant with PAP treatment, not only for insurance purposes but primarily for the patient's long term health benefit. I answered all his questions today and the patient was in agreement. I would like to see him back after the sleep study is completed and encouraged him to call with any interim questions, concerns, problems or updates.   Thank you very much for allowing me to participate in the care of this nice patient. If I can be of any further assistance to you please do not hesitate to call me at [phone].  Sincerely,   Steven Foley, MD, PhD

## 2013-02-04 ENCOUNTER — Ambulatory Visit (INDEPENDENT_AMBULATORY_CARE_PROVIDER_SITE_OTHER): Payer: 59 | Admitting: Neurology

## 2013-02-04 VITALS — BP 122/73 | HR 72 | Ht 71.0 in | Wt 300.0 lb

## 2013-02-04 DIAGNOSIS — G4733 Obstructive sleep apnea (adult) (pediatric): Secondary | ICD-10-CM

## 2013-02-04 DIAGNOSIS — G4720 Circadian rhythm sleep disorder, unspecified type: Secondary | ICD-10-CM

## 2013-02-04 DIAGNOSIS — G479 Sleep disorder, unspecified: Secondary | ICD-10-CM

## 2013-02-13 ENCOUNTER — Telehealth: Payer: Self-pay | Admitting: Neurology

## 2013-02-13 DIAGNOSIS — G4733 Obstructive sleep apnea (adult) (pediatric): Secondary | ICD-10-CM

## 2013-02-13 NOTE — Telephone Encounter (Signed)
Please call and notify the patient that the recent sleep study did confirm the diagnosis of obstructive sleep apnea - in the moderate category - and that I recommend treatment for this in the form of CPAP. This will require a repeat sleep study for proper titration and mask fitting. Please explain to patient and arrange for a CPAP titration study. I have placed an order in the chart. Thanks, Huston Foley, MD, PhD [facility])

## 2013-02-16 ENCOUNTER — Encounter: Payer: Self-pay | Admitting: Family Medicine

## 2013-02-18 ENCOUNTER — Encounter: Payer: Self-pay | Admitting: Family Medicine

## 2013-02-18 ENCOUNTER — Ambulatory Visit (INDEPENDENT_AMBULATORY_CARE_PROVIDER_SITE_OTHER): Payer: 59 | Admitting: Family Medicine

## 2013-02-18 VITALS — BP 122/74 | HR 78 | Temp 98.2°F | Resp 16 | Ht 72.0 in | Wt 298.0 lb

## 2013-02-18 DIAGNOSIS — G4733 Obstructive sleep apnea (adult) (pediatric): Secondary | ICD-10-CM | POA: Insufficient documentation

## 2013-02-18 DIAGNOSIS — E669 Obesity, unspecified: Secondary | ICD-10-CM

## 2013-02-18 DIAGNOSIS — E559 Vitamin D deficiency, unspecified: Secondary | ICD-10-CM

## 2013-02-18 DIAGNOSIS — I10 Essential (primary) hypertension: Secondary | ICD-10-CM

## 2013-02-18 NOTE — Progress Notes (Signed)
S:  This 52 y.o. AA male has well controlled HTN but inquires about what he needs to do do "get off medication"; he has never had to take any medications and is not pleased with the idea of chronic medication. He has changes nutrition such that he eats smaller portions and is trying to be more active but not getting the eweight off. He went for sleep study but reports no one has contacted him about the results; he also requested result/report be mailed to him and he has received nothing; Study was done 5 days ago on 02/13/13. (I was able to locate test result per documentation per Dr. Frances Furbish who instructed a staff member to contact pt w/ results and sch a follow-up CPAP titration study). Pt reports ongoing sleep difficulties.He is willing to return to sleep lab for further evaluation.  Patient Active Problem List   Diagnosis Date Noted  . OSA (obstructive sleep apnea) 02/18/2013  . Unspecified essential hypertension 11/14/2012  . Hypertrophy of prostate without urinary obstruction and other lower urinary tract symptoms (LUTS) 11/14/2012  . Severe obesity (BMI >= 40) 11/14/2012   PMHx, Soc Hx and Fam Hx reviewed.   ROS: As per HPI; negative for diaphoresis, fatigue, abnormal weight gain, CP or tightness, palpitations, edema, SOB or DOE, bronchospasms, myalgias, cold extremities, weakness,numbness, HA, tremor or syncope. He does not report depression or dysmorphic mood.  O: Filed Vitals:   02/18/13 0836  BP: 122/74  Pulse: 78  Temp: 98.2 F (36.8 C)  Resp: 16   GEN: In NAD; WN,WD. Weight down below 300 lbs. HENT: [place]/AT; EOMI w/ clear conj/sclerae. EACs/nose/oroph unremarkable. COR: RRR. LUNGS: Normal resp rate and effort. SKIN: W&D; no rashes or erythema. MS: MAEs; no joint deformities or effusions. Noc/c/e. NEURO: A&O x 3; CNs intact. Nonfocal.  A/P: HTN, goal below 150/90- Stable on current medication; no change.  OSA (obstructive sleep apnea)- Pt given phone # to contact [facility] (Dr.  Teofilo Pod office); he will schedule CPAP titration study and follow-up with that physician.  Severe obesity (BMI >= 40)- Continue weight loss effort and get OTC Vitamin D3 2000 IU as advised several months ago. Also advised that weight loss difficulty may be linked to OSA.

## 2013-02-18 NOTE — Patient Instructions (Addendum)
Vitamin D Deficiency Vitamin D is an important vitamin that your body needs. Having too little of it in your body is called a deficiency. A very bad deficiency can make your bones soft and can cause a condition called rickets.  Vitamin D is important to your body for different reasons, such as:   It helps your body absorb 2 minerals called calcium and phosphorus.  It helps make your bones healthy.  It may prevent some diseases, such as diabetes and multiple sclerosis.  It helps your muscles and heart. You can get vitamin D in several ways. It is a natural part of some foods. The vitamin is also added to some dairy products and cereals. Some people take vitamin D supplements. Also, your body makes vitamin D when you are in the sun. It changes the sun's rays into a form of the vitamin that your body can use. CAUSES   Not eating enough foods that contain vitamin D.  Not getting enough sunlight.  Having certain digestive system diseases that make it hard to absorb vitamin D. These diseases include Crohn's disease, chronic pancreatitis, and cystic fibrosis.  Having a surgery in which part of the stomach or small intestine is removed.  Being obese. Fat cells pull vitamin D out of your blood. That means that obese people may not have enough vitamin D left in their blood and in other body tissues.  Having chronic kidney or liver disease. RISK FACTORS Risk factors are things that make you more likely to develop a vitamin D deficiency. They include:  Being older.  Not being able to get outside very much.  Living in a nursing home.  Having had broken bones.  Having weak or thin bones (osteoporosis).  Having a disease or condition that changes how your body absorbs vitamin D.  Having dark skin.  Some medicines such as seizure medicines or steroids.  Being overweight or obese. SYMPTOMS Mild cases of vitamin D deficiency may not have any symptoms. If you have a very bad case, symptoms  may include:  Bone pain.  Muscle pain.  Falling often.  Broken bones caused by a minor injury, due to osteoporosis. DIAGNOSIS A blood test is the best way to tell if you have a vitamin D deficiency. TREATMENT Vitamin D deficiency can be treated in different ways. Treatment for vitamin D deficiency depends on what is causing it. Options include:  Taking vitamin D supplements.  Taking a calcium supplement. Your caregiver will suggest what dose is best for you. HOME CARE INSTRUCTIONS  Take any supplements that your caregiver prescribes. Follow the directions carefully. Take only the suggested amount.  Have your blood tested 2 months after you start taking supplements.  Eat foods that contain vitamin D. Healthy choices include:  Fortified dairy products, cereals, or juices. Fortified means vitamin D has been added to the food. Check the label on the package to be sure.  Fatty fish like salmon or trout.  Eggs.  Oysters.  Spend some time in the sun. Most people should get out in the sun without sunblock for 10 to 15 minutes at a time. Do this 3 times a week. People who have had skin cancer should not do this. Ask your caregiver how long you should be in the sun. Do not use a tanning bed.  Keep your weight at a healthy level. Lose weight if you need to.  Keep all follow-up appointments. Your caregiver will need to perform blood tests to make sure your  vitamin D deficiency is going away. SEEK MEDICAL CARE IF:  You have any questions about your treatment.  You continue to have symptoms of vitamin D deficiency.  You have nausea or vomiting.  You are constipated.  You feel confused.  You have severe abdominal or back pain. MAKE SURE YOU:  Understand these instructions.  Will watch your condition.  Will get help right away if you are not doing well or get worse. Document Released: 08/13/2011 Document Reviewed: 08/13/2011 [facility] Patient Information 2014 [place],  [place].   Get an over-the-counter Vitamin D3 supplement 2000 IU and take 1 capsule daily; if this is an oil-containing supplement, take it separate from other solid pills and capsules.  Also try to eat some of the foods that contain Vitamin D naturally. It is important to get some sin exposure for 10-15 minutes most days of the week to help your body use the Vitamin D.  Contact Dr. Teofilo Pod office ([facility]) at [phone]- to find out about your report of the Sleep Study and the follow-up CPAP titration study that has been recommended.  Sleep Apnea Sleep apnea is disorder that affects a person's sleep. A person with sleep apnea has abnormal pauses in their breathing when they sleep. It is hard for them to get a good sleep. This makes a person tired during the day. It also can lead to other physical problems. There are three types of sleep apnea. One type is when breathing stops for a short time because your airway is blocked (obstructive sleep apnea). Another type is when the brain sometimes fails to give the normal signal to breathe to the muscles that control your breathing (central sleep apnea). The third type is a combination of the other two types. HOME CARE  Do not sleep on your back. Try to sleep on your side.  Take all medicine as told by your doctor.  Avoid alcohol, calming medicines (sedatives), and depressant drugs.  Try to lose weight if you are overweight. Talk to your doctor about a healthy weight goal. Your doctor may have you use a device that helps to open your airway. It can help you get the air that you need. It is called a positive airway pressure (PAP) device. There are three types of PAP devices:  Continuous positive airway pressure (CPAP) device.  Nasal expiratory positive airway pressure (EPAP) device.  Bilevel positive airway pressure (BPAP) device. MAKE SURE YOU:  Understand these instructions.  Will watch your condition.  Will get help right away if  you are not doing well or get worse. Document Released: 02/28/2008 Document Revised: 05/07/2012 Document Reviewed: 09/22/2011 [facility] Patient Information 2014 [place], [place].

## 2013-02-19 ENCOUNTER — Encounter: Payer: Self-pay | Admitting: *Deleted

## 2013-02-19 NOTE — Telephone Encounter (Signed)
Called patient to discuss sleep study results.  Discussed findings, recommendations and follow up care.  Patient understood well and all questions were answered.   Follow up titration was scheduled for Tuesday 03/10/13 at 9:30 PM (pt prefers Tues and Thurs nights).  [employer] will be contacted to get prior authorization and pt will be notified if study isn't covered.  Copy of report mailed to pt with letter stating follow up appt time/date, copy of report faxed to Dr. Audria Nine. -sh

## 2013-03-10 ENCOUNTER — Ambulatory Visit (INDEPENDENT_AMBULATORY_CARE_PROVIDER_SITE_OTHER): Payer: 59 | Admitting: Neurology

## 2013-03-10 DIAGNOSIS — G479 Sleep disorder, unspecified: Secondary | ICD-10-CM

## 2013-03-10 DIAGNOSIS — G4720 Circadian rhythm sleep disorder, unspecified type: Secondary | ICD-10-CM

## 2013-03-10 DIAGNOSIS — G4733 Obstructive sleep apnea (adult) (pediatric): Secondary | ICD-10-CM

## 2013-03-20 ENCOUNTER — Telehealth: Payer: Self-pay | Admitting: Neurology

## 2013-03-20 DIAGNOSIS — G4733 Obstructive sleep apnea (adult) (pediatric): Secondary | ICD-10-CM

## 2013-03-20 NOTE — Telephone Encounter (Signed)
Please call and notify patient that the recent sleep study showed good results with CPAP. Therefore, I would like start the patient on CPAP at home. I placed the order in the chart.   Arrange for CPAP set up at home through a DME company of patient's choice and fax/route report to PCP and referring MD (if other than PCP).   The patient will also need a follow up appointment with me in 6-8 weeks post set up that has to be scheduled; help the patient schedule this (in a follow-up slot).   Please re-enforce the importance of compliance with treatment and the need for [place] to monitor compliance data.   Once you have spoken to the patient and scheduled the return appointment, you may close this encounter, thanks,   Huston Foley, MD, PhD [facility] ([facility])

## 2013-03-24 ENCOUNTER — Encounter: Payer: Self-pay | Admitting: *Deleted

## 2013-03-24 NOTE — Telephone Encounter (Signed)
I called and left a message for the patient that his recent CPAP study showed good results and that Dr. Frances Furbish would like for him to start using CPAP at home. I informed the patient that I will send his order to a DME company and they'll contact with benefits for the machine. I also informed the patient that Dr. Frances Furbish would like to see him back in 6-8 weeks for post CPAP. I informed the patient that I will send a copy to him and fax a copy to Dr. Audria Nine.

## 2013-04-09 ENCOUNTER — Other Ambulatory Visit: Payer: Self-pay

## 2013-06-25 ENCOUNTER — Ambulatory Visit: Payer: 59 | Admitting: Family Medicine

## 2013-07-16 ENCOUNTER — Other Ambulatory Visit: Payer: Self-pay | Admitting: Family Medicine

## 2013-08-18 ENCOUNTER — Encounter: Payer: Self-pay | Admitting: Neurology

## 2013-08-19 NOTE — Progress Notes (Signed)
Quick Note:  I reviewed the patient's CPAP compliance data from 07/15/2013 to 08/13/2013, which is a total of 30 days, during which time the patient used CPAP on 21 days. The average usage for all days was only 2 hours and 11 minutes. The percent used days greater than 4 hours was very low at 23 %, indicating poor compliance. The residual AHI was 0.6 per hour, indicating an adequate treatment pressure of 11 cwp with EPR of 1. The patient is not scheduled for a followup appointment with me and has to make an appointment for followup. We will get in touch with the patient regarding his poor compliance. Furthermore, we should get in touch with his DME company to see if there is help they can provide to ensure proper tolerance and compliance.   Huston FoleySaima Tucker Steedley, MD, PhD [facility] ([facility])   ______

## 2013-08-26 ENCOUNTER — Ambulatory Visit: Payer: 59 | Admitting: Family Medicine

## 2013-08-27 ENCOUNTER — Encounter: Payer: Self-pay | Admitting: Neurology

## 2013-08-31 ENCOUNTER — Encounter: Payer: Self-pay | Admitting: Neurology

## 2013-08-31 ENCOUNTER — Ambulatory Visit (INDEPENDENT_AMBULATORY_CARE_PROVIDER_SITE_OTHER): Payer: 59 | Admitting: Neurology

## 2013-08-31 VITALS — BP 118/71 | HR 68 | Ht 70.0 in | Wt 305.0 lb

## 2013-08-31 DIAGNOSIS — E669 Obesity, unspecified: Secondary | ICD-10-CM

## 2013-08-31 DIAGNOSIS — G4700 Insomnia, unspecified: Secondary | ICD-10-CM

## 2013-08-31 DIAGNOSIS — G4733 Obstructive sleep apnea (adult) (pediatric): Secondary | ICD-10-CM

## 2013-08-31 DIAGNOSIS — G4730 Sleep apnea, unspecified: Secondary | ICD-10-CM

## 2013-08-31 NOTE — Progress Notes (Signed)
Subjective:    Patient ID: Steven Simpson is a 53 y.o. male.  HPI    Interim history:   Steven Simpson is a very friendly 53 year old right-handed gentleman with an underlying medical history of hypertension, prostate problems, and obesity, who presents for followup consultation of his obstructive sleep apnea. He is unaccompanied today. I first met him on 01/02/2013 at the request of his primary care physician, at which time he reported snoring, ankle edema, and waking up with a sense of claustrophobia. I suggested he return for sleep study. He had a baseline sleep study on 02/04/2013, and was requested to return for CPAP titration study which he had on 03/10/2013. I went over his test results in detail today.   His baseline sleep study on 02/04/2013 showed a sleep efficiency of 78.2% with a latency to sleep of 25.5 minutes and wake after sleep onset of 67.5 minutes with severe sleep fragmentation noted. The arousal index was elevated at 17.9 per hour. He had an increased percentage of stage I and stage II sleep, near absence of slow-wave sleep and a decreased percentage of REM sleep with a borderline prolonged REM latency. Mild to moderate snoring was noted. His total AHI was 17.6 per hour, rising to 24.7 per hour in the supine position. Baseline oxygen saturation was 92% with a nadir of 82%. Time below 90% saturation was one hour, 2 minutes and 53 seconds. Based on those test results I advised him to return for a full night sleep study for proper CPAP titration and mask fitting.  His CPAP titration study from 03/10/2013 showed a sleep efficiency of 79.6% with a latency to sleep of 10 minutes and wake after sleep onset of 81 minutes with moderate to severe sleep fragmentation noted. He had an elevated arousal index. He had an elevated percentage of stage I and stage II sleep, near absence of slow-wave sleep and a decreased percentage of REM sleep with a mildly prolonged REM latency. He had no  significant EKG changes or periodic leg movements of sleep. He had elimination of snoring. He was titrated on CPAP from 5 cm to 15 cm. His AHI was reduced to 2 per hour on a pressure of 11 cm with CPR of 1. Baseline oxygen saturation was 93%, nadir was 87%.  I reviewed the patient's CPAP compliance data from 07/15/2013 to 08/13/2013, which is a total of 30 days, during which time the patient used CPAP only on 21 days. The average usage for all days was only 2 hours and 11 minutes. The percent used days greater than 4 hours was very low at 23 %, indicating poor compliance. The residual AHI was 0.6 per hour, indicating an adequate treatment pressure of 11 cwp with EPR of 1. Air leak was low.  Today, I reviewed the patient's CPAP compliance data from 08/01/13 to 08/30/13, which is a total of 30 days, during which time the patient used CPAP on 24 days. The average usage for all days was 2 hours and 7 minutes. The percent used days greater than 4 hours was 17%, indicating poor compliance. The residual AHI was 0.5 per hour, indicating an appropriate treatment pressure of 11 cwp with EPR of 1.   Today, he reports that he cannot maintain sleep. He has tried OTC benadryl type medications. He has not tried melatonin. He tosses and turns. He has not tried [place]. He seems to tolerate the pressure and the mask.  His Past Medical History Is Significant For: Past Medical History  Diagnosis Date  . Pre-hypertension     His Past Surgical History Is Significant For: Past Surgical History  Procedure Laterality Date  . Vasectomy  2012    His Family History Is Significant For: Family History  Problem Relation Age of Onset  . Dementia Mother     early stage  . Heart disease Father   . Cancer Sister     throat  . Diabetes Brother     diabetes type 1    His Social History Is Significant For: History   Social History  . Marital Status: Married    Spouse Name: Pamala Hurry    Number of Children: 2  . Years of  Education: MA   Occupational History  . COMPUTER ANALYST [employer]   Social History Main Topics  . Smoking status: Never Smoker   . Smokeless tobacco: Never Used  . Alcohol Use: No  . Drug Use: No  . Sexual Activity: Yes     Comment: number of sex partners in the last 55 months 1    [name]   Other Topics Concern  . None   Social History Narrative   Patient lives at home with spouse.   Caffeine Use: 1 soda weekly    His Allergies Are:  No Known Allergies:   His Current Medications Are:  Outpatient Encounter Prescriptions as of 08/31/2013  Medication Sig  . atenolol-chlorthalidone (TENORETIC) 50-25 MG per tablet Take 1 tablet by mouth daily. PATIENT NEEDS OFFICE VISIT FOR ADDITIONAL REFILLS  :  Review of Systems:  Out of a complete 14 point review of systems, all are reviewed and negative with the exception of these symptoms as listed below:  Review of Systems  HENT:       Frequent walking  Respiratory: Positive for apnea.   Psychiatric/Behavioral: Positive for sleep disturbance.    Objective:  Neurologic Exam  Physical Exam Physical Examination:   Filed Vitals:   08/31/13 1049  BP: 118/71  Pulse: 68     General Examination: The patient is a very pleasant 53 y.o. male in no acute distress. He appears well-developed and well-nourished and well groomed. He is obese.   HEENT: Normocephalic, atraumatic, pupils are equal, round and reactive to light and accommodation. Extraocular tracking is good without limitation to gaze excursion or nystagmus noted. Normal smooth pursuit is noted. Hearing is grossly intact. Face is symmetric with normal facial animation and normal facial sensation. Speech is clear with no dysarthria noted. There is no hypophonia. There is no lip, neck/head, jaw or voice tremor. Neck is supple with full range of passive and active motion. There are no carotid bruits on auscultation. Oropharynx exam reveals: mild mouth  dryness, adequate dental hygiene and moderate airway crowding, due to larger tongue, thick soft palate. Mallampati is class II. Tongue protrudes centrally and palate elevates symmetrically. Tonsils are small.  Chest: Clear to auscultation without wheezing, rhonchi or crackles noted.  Heart: S1+S2+0, regular and normal without murmurs, rubs or gallops noted.   Abdomen: Soft, non-tender and non-distended with normal bowel sounds appreciated on auscultation.  Extremities: There is 1+ pitting edema in the distal lower extremities bilaterally. Pedal pulses are intact.  Skin: Warm and dry without trophic changes noted. There are no varicose veins.  Musculoskeletal: exam reveals no obvious joint deformities, tenderness or joint swelling or erythema.   Neurologically:  Mental status: The patient is awake, alert and oriented in all 4 spheres. His memory, attention, language and  knowledge are appropriate. There is no aphasia, agnosia, apraxia or anomia. Speech is clear with normal prosody and enunciation. Thought process is linear. Mood is congruent and affect is normal.  Cranial nerves are as described above under HEENT exam. In addition, shoulder shrug is normal with equal shoulder height noted. Motor exam: Normal bulk, strength and tone is noted. There is no drift, tremor or rebound. Romberg is negative. Reflexes are 2+ throughout. Toes are downgoing bilaterally. Fine motor skills are intact with normal finger taps, normal hand movements, normal rapid alternating patting, normal foot taps and normal foot agility.  Cerebellar testing shows no dysmetria or intention tremor on finger to nose testing. Heel to shin is unremarkable bilaterally. There is no truncal or gait ataxia.  Sensory exam is intact to light touch, pinprick, vibration, temperature sense in the upper and lower extremities.  Gait, station and balance are unremarkable. No veering to one side is noted. No leaning to one side is noted. Posture  is age-appropriate and stance is narrow based. No problems turning are noted. He turns en bloc. Tandem walk is unremarkable. Intact toe and heel stance is noted.               Assessment and Plan:   In summary, Steven Simpson is a very pleasant 53 y.o.-year old male with an underlying medical history of hypertension, prostate problems, and obesity, who presents for followup consultation of his moderate obstructive sleep apnea. He had a baseline sleep study in September 2014 followed by a CPAP titration study in October 2014. While we improved his sleep-disordered breathing, his sleep architecture and sleep consolidation was not that much better and he continues to endorse problems maintaining sleep. His biggest issue is waking up a couple of hours after falling asleep and then he does not go back to sleep. He has tried over-the-counter Benadryl type medications but never melatonin. He has never been on a prescription sleep aid as I understand. I discussed sleep study findings in detail with him as well as his compliance data. I advised him strongly not to skip any days. For sleep maintenance issues I have advised him to with atry melatonin for now. After a month I would like for him to call us with an update. At that point we may resort to trying a prescription sleep aid such as Ambien. He understands that he has to use of CPAP when using Ambien to avoid any additional breathing related issues at night. He is not keen on starting a prescription sleep aid at this time. I understand and I would like to see how he does with melatonin. I have encouraged him to use CPAP regularly to help reduce his cardiovascular risks down the road but also to improve his lower extremity swelling which seems to be unchanged.    We also talked about trying to maintaining a healthy lifestyle in general. I encouraged the patient to eat healthy, exercise daily and keep well hydrated, to keep a scheduled bedtime and wake time routine,  to not skip any meals and eat healthy snacks in between meals and to have protein with every meal. I stressed the importance of regular exercise.   I answered all his questions today and the patient was in agreement with the above outlined plan. I would like to see the patient back in 3 months, sooner if the need arises and encouraged him to call with any interim questions, concerns, problems or updates.

## 2013-08-31 NOTE — Patient Instructions (Addendum)
Please continue using your CPAP regularly. While your insurance requires that you use CPAP at least 4 hours each night on 70% of the nights, I recommend, that you not skip any nights and use it throughout the night if you can. Getting used to CPAP does take time and patience and discipline. Untreated obstructive sleep apnea when it is moderate to severe can have an adverse impact on cardiovascular health and raise her risk for heart disease, arrhythmias, hypertension, congestive heart failure, stroke and diabetes. Untreated obstructive sleep apnea causes sleep disruption, nonrestorative sleep, and sleep deprivation. This can have an impact on your day to day functioning and cause daytime sleepiness and impairment of cognitive function, memory loss, mood disturbance, and problems focussing. Using CPAP regularly can improve these symptoms.  Please remember to try to maintain good sleep hygiene, which means: Keep a regular sleep and wake schedule, try not to exercise or have a meal within 2 hours of your bedtime, try to keep your bedroom conducive for sleep, that is, cool and dark, without light distractors such as an illuminated alarm clock, and refrain from watching TV right before sleep or in the middle of the night and do not keep the TV or radio on during the night. Also, try not to use or play on electronic devices at bedtime, such as your cell phone, tablet PC or laptop. If you like to read at bedtime on an electronic device, try to dim the background light as much as possible. Do not eat in the middle of the night.   Try Melatonin at night for sleep: take 3 to 5 mg one hour before your bedtime. Call in one month for an update: we may resort to trying a prescription sleeping pill, but you have to use your CPAP with the sleep aid, to help your breathing at night.

## 2013-09-03 ENCOUNTER — Encounter: Payer: Self-pay | Admitting: Neurology

## 2013-11-19 ENCOUNTER — Encounter: Payer: Self-pay | Admitting: Family Medicine

## 2013-11-20 ENCOUNTER — Ambulatory Visit (INDEPENDENT_AMBULATORY_CARE_PROVIDER_SITE_OTHER): Payer: 59 | Admitting: Family Medicine

## 2013-11-20 ENCOUNTER — Encounter: Payer: Self-pay | Admitting: Family Medicine

## 2013-11-20 VITALS — BP 137/90 | HR 78 | Temp 98.2°F | Resp 16 | Ht 70.75 in | Wt 294.0 lb

## 2013-11-20 DIAGNOSIS — Z0000 Encounter for general adult medical examination without abnormal findings: Secondary | ICD-10-CM

## 2013-11-20 DIAGNOSIS — I10 Essential (primary) hypertension: Secondary | ICD-10-CM

## 2013-11-20 DIAGNOSIS — R609 Edema, unspecified: Secondary | ICD-10-CM

## 2013-11-20 LAB — POCT GLYCOSYLATED HEMOGLOBIN (HGB A1C): HEMOGLOBIN A1C: 5.1

## 2013-11-20 MED ORDER — ATENOLOL-CHLORTHALIDONE 50-25 MG PO TABS: 1.0000 | ORAL_TABLET | Freq: Every day | ORAL | Status: DC

## 2013-11-20 NOTE — Patient Instructions (Addendum)
Keeping you healthy  Get these tests  Blood pressure- Have your blood pressure checked once a year by your healthcare provider.  Normal blood pressure is 120/80  Weight- Have your body mass index (BMI) calculated to screen for obesity.  BMI is a measure of body fat based on height and weight. You can also calculate your own BMI at [email or web address].  Cholesterol- Have your cholesterol checked every year.  Diabetes- Have your blood sugar checked regularly if you have high blood pressure, high cholesterol, have a family history of diabetes or if you are overweight.  Screening for Colon Cancer- Colonoscopy starting at age 87.  Screening may begin sooner depending on your family history and other health conditions. Follow up colonoscopy as directed by your Gastroenterologist.  Screening for Prostate Cancer- Both blood work (PSA) and a rectal exam help screen for Prostate Cancer.  Screening begins at age 20 with African-American men and at age 21 with Caucasian men.  Screening may begin sooner depending on your family history. Your PSA in 2014 = 0.66, well within normal limits. I recommend you have the digital rectal exam next year or if you develop symptoms.  Take these medicines  Aspirin- One aspirin daily can help prevent Heart disease and Stroke.  Flu shot- Every fall.  Tetanus- Every 10 years. Next due in 2019.  Zostavax- Once after the age of 82 to prevent Shingles.  Pneumonia shot- Once after the age of 2; if you are younger than 61, ask your healthcare provider if you need a Pneumonia shot.  Take these steps  Don't smoke- If you do smoke, talk to your doctor about quitting.  For tips on how to quit, go to www.smokefree.gov or call 1-800-QUIT-NOW.  Be physically active- Exercise 5 days a week for at least 30 minutes.  If you are not already physically active start slow and gradually work up to 30 minutes of moderate physical activity.  Examples of moderate activity include  walking briskly, mowing the yard, dancing, swimming, bicycling, etc.  Eat a healthy diet- Eat a variety of healthy food such as fruits, vegetables, low fat milk, low fat cheese, yogurt, lean meant, poultry, fish, beans, tofu, etc. For more information go to www.thenutritionsource.org  Drink alcohol in moderation- Limit alcohol intake to less than two drinks a day. Never drink and drive.  Dentist- Brush and floss twice daily; visit your dentist twice a year.  Depression- Your emotional health is as important as your physical health. If you're feeling down, or losing interest in things you would normally enjoy please talk to your healthcare provider.  Eye exam- Visit your eye doctor every year.  Safe sex- If you may be exposed to a sexually transmitted infection, use a condom.  Seat belts- Seat belts can save your life; always wear one.  Smoke/Carbon Monoxide detectors- These detectors need to be installed on the appropriate level of your home.  Replace batteries at least once a year.  Skin cancer- When out in the sun, cover up and use sunscreen 15 SPF or higher.  Violence- If anyone is threatening you, please tell your healthcare provider.  Living Will/ Health care power of attorney- Speak with your healthcare provider and family.   Exercise to Lose Weight Exercise and a healthy diet may help you lose weight. Your doctor may suggest specific exercises. EXERCISE IDEAS AND TIPS  Choose low-cost things you enjoy doing, such as walking, bicycling, or exercising to workout videos.  Take stairs instead of the elevator.  Walk during your lunch break.  Park your car further away from work or school.  Go to a gym or an exercise class.  Start with 5 to 10 minutes of exercise each day. Build up to 30 minutes of exercise 4 to 6 days a week.  Wear shoes with good support and comfortable clothes.  Stretch before and after working out.  Work out until you breathe harder and your heart  beats faster.  Drink extra water when you exercise.  Do not do so much that you hurt yourself, feel dizzy, or get very short of breath. Exercises that burn about 150 calories:  Running 1  miles in 15 minutes.  Playing volleyball for 45 to 60 minutes.  Washing and waxing a car for 45 to 60 minutes.  Playing touch football for 45 minutes.  Walking 1  miles in 35 minutes.  Pushing a stroller 1  miles in 30 minutes.  Playing basketball for 30 minutes.  Raking leaves for 30 minutes.  Bicycling 5 miles in 30 minutes.  Walking 2 miles in 30 minutes.  Dancing for 30 minutes.  Shoveling snow for 15 minutes.  Swimming laps for 20 minutes.  Walking up stairs for 15 minutes.  Bicycling 4 miles in 15 minutes.  Gardening for 30 to 45 minutes.  Jumping rope for 15 minutes.  Washing windows or floors for 45 to 60 minutes. Document Released: 06/23/2010 Document Revised: 08/13/2011 Document Reviewed: 06/23/2010 [facility] Patient Information 2015 [place]ExitCare, [place]. This information is not intended to replace advice given to you by your health care provider. Make sure you discuss any questions you have with your health care provider.    Heart Disease Prevention Heart disease can lead to heart attacks and strokes. This is a leading cause of death. Heart disease can be inherited and can be caused from the lifestyle you lead. You can do a lot to keep your heart and blood vessels healthy.  WHAT SHOULD I DO EACH DAY TO KEEP MY HEART HEALTHY?  Do not smoke.  Follow a healthy eating plan as recommended by your caregiver or dietitian.  Be active for a total of 30 minutes most days. Ask your caregiver what activities are best for you.  Limit the amount of alcohol you drink.  Involve family and friends to help you with a healthy lifestyle. HOW DOES HEART DISEASE CAUSE HIGH BLOOD PRESSURE?  Narrowed blood vessels leave a smaller opening for blood to flow through. It is like turning on  a garden hose and holding your thumb over the opening. The smaller opening makes the water shoot out with more pressure. In the same way, narrowed blood vessels can lead to high blood pressure. Other factors, such as kidney problems and being overweight, also can lead to high blood pressure.  If you have high blood pressure you may need to take blood pressure medicine every day. Some types of blood pressure medicine can also help keep your kidneys healthy.  Many people with diabetes also have high blood pressure. If you have heart, eye, or kidney problems from diabetes, high blood pressure can make them worse. HOW DO MY BLOOD VESSELS GET CLOGGED?  Cholesterol is a substance that is made by the body and used for many important functions. It is also found in food that comes from animals. When your cholesterol is high, it can stick to the insides of your blood vessels, making them narrowed and even clogged. This problem is called atherosclerosis.  Narrowed and clogged  blood vessels make it harder for blood to get to important body organs. This can cause problems such as:  Chest pain (angina). Angina can cause temporary pain in your chest, arms, shoulders, or back. You may feel the pain more when your heart beats faster, such as when you exercise. The pain may go away when you rest. You also may feel very weak and sweaty.  A heart attack. A heart attack happens when a blood vessel in or near the heart becomes blocked. Not enough blood is getting to the heart. During a heart attack, you may have chest pain in your chest, arms, shoulders, or back along with nausea, indigestion, extreme weakness, and sweating. WHAT CAN I DO TO PREVENT HEART DISEASE?   Keep your blood pressure under control as recommended by your caregiver.  Keep your cholesterol under control. Have it checked at least once a year. Target cholesterol levels for most people are:  Total blood cholesterol level: Below 200.  LDL (bad)  cholesterol: Below 100.  HDL (good) cholesterol: Above 40 in men and above 50 in women.  Triglycerides (another type of fat in the blood): Below 150.  Make physical activity a part of your daily routine. Check with your caregiver to learn what activities are best for you.  Make sure that the foods you eat are "heart-healthy."  Include foods high in fiber, such as oat bran, oatmeal, whole-grain breads and cereals.  Cut back on fried foods and foods high in saturated fat. This includes foods such as meats, butter, whole dairy products, shortening, and coconut or palm oil.  Avoid salty foods such as canned food, luncheon meat, salty snacks, and fast food.  Eat more fruits and vegetables.  Drink less alcohol.  Lose weight as recommended by your caregiver.  If you smoke, quit. Your caregiver can help you with quitting options.  Ask your caregiver whether you should take a daily aspirin. Studies have shown that taking aspirin can help reduce your risk of heart disease and stroke.  Take your prescribed medicines as directed. WHAT ARE THE WARNING SIGNS OF A HEART ATTACK? You may have one or more of the following warning signs:  Chest pain or discomfort.  Pain or discomfort in your arms, back, jaw, or neck.  Indigestion or stomach pain.  Shortness of breath.  Sweating.  Nausea or vomiting.  Lightheadedness.  No warning signs at all or they may come and go. FOR MORE INFORMATION  To find out more about heart disease and stroke prevention, visit the American Heart Association website at www.americanheart.org Document Released: 01/03/2004 Document Revised: 11/20/2011 Document Reviewed: 07/18/2007 [facility] Patient Information 2015 [place]Care, [place]LL[place]. This information is not intended to replace advice given to you by your health care provider. Make sure you discuss any questions you have with your health care provider.    FOR SLEEP: Try MELATONIN 3 mg tablet- take 1 tablet at  bedtime to see if it will help solidify your sleep.

## 2013-11-20 NOTE — Progress Notes (Signed)
Subjective:    Patient ID: Shea Stakesathaniel Pinkstaff, male    DOB: 1961-03-23, 53 y.o.   MRN: [id number]  HPI  This 53 y.o. AA male is here for CPE; he has HTN and OSA. HE is compliant w/ medication and use of CPAP but has some problem w/ sleep hygiene. His sleep pattern is broken and this is a chronic problem; pt states this is not related to sleep apnea. Pt is obese but not exercising on a regular basis.  HCM: IMM- Current; does not get Flu vaccine.           CRS- August 2014 (normal).           Vision- Current.  Patient Active Problem List   Diagnosis Date Noted  . OSA (obstructive sleep apnea) 02/18/2013  . Unspecified essential hypertension 11/14/2012  . Hypertrophy of prostate without urinary obstruction and other lower urinary tract symptoms (LUTS) 11/14/2012  . Severe obesity (BMI >= 40) 11/14/2012   Prior to Admission medications   Medication Sig Start Date End Date Taking? Authorizing Provider  atenolol-chlorthalidone (TENORETIC) 50-25 MG per tablet Take 1 tablet by mouth daily.    Maurice MarchBarbara B McPherson, MD   PMHx, Surg Hx, Soc and Fam Hx reviewed.   Review of Systems  Constitutional: Negative.   HENT: Negative.   Eyes: Negative.   Respiratory: Negative.   Cardiovascular: Positive for leg swelling.       Chronic dependent edema, relieved w/ elevation and compression hose.  Gastrointestinal: Negative.   Endocrine: Negative.   Genitourinary: Negative.   Musculoskeletal: Negative.   Skin: Negative.   Allergic/Immunologic: Positive for environmental allergies.  Neurological: Negative.   Hematological: Negative.   Psychiatric/Behavioral: Negative.       Objective:   Physical Exam  Nursing note and vitals reviewed. Constitutional: He is oriented to person, place, and time. Vital signs are normal. He appears well-developed and well-nourished. No distress.  HENT:  Head: Normocephalic and atraumatic.  Right Ear: Hearing, tympanic membrane, external ear and ear canal normal.    Left Ear: Hearing, tympanic membrane, external ear and ear canal normal.  Nose: No mucosal edema, nasal deformity or septal deviation.  Mouth/Throat: Uvula is midline, oropharynx is clear and moist and mucous membranes are normal. No oral lesions. Normal dentition. No dental caries.  Eyes: Conjunctivae, EOM and lids are normal. Pupils are equal, round, and reactive to light. No scleral icterus.  Fundoscopic exam:      The right eye shows no hemorrhage and no papilledema. The right eye shows red reflex.       The left eye shows no hemorrhage and no papilledema. The left eye shows red reflex.  Neck: Trachea normal, normal range of motion and full passive range of motion without pain. Neck supple. No JVD present. No spinous process tenderness and no muscular tenderness present. Carotid bruit is not present. Normal range of motion present. Thyromegaly present.  Thyroid gland very slightly enlarged.  Cardiovascular: Normal rate, regular rhythm, S1 normal, S2 normal, normal heart sounds and normal pulses.   No extrasystoles are present. PMI is not displaced.  Exam reveals no gallop and no friction rub.   No murmur heard. Pulmonary/Chest: Effort normal and breath sounds normal. No respiratory distress. He has no decreased breath sounds. He has no wheezes. He has no rales.  Abdominal: Soft. Normal appearance, normal aorta and bowel sounds are normal. He exhibits no distension, no pulsatile midline mass and no mass. There is no hepatosplenomegaly. There is no  tenderness. There is no guarding and no CVA tenderness. No hernia.  Genitourinary:  Deferred.  Musculoskeletal:       Cervical back: Normal.       Thoracic back: Normal.       Lumbar back: Normal.  Remainder of exam unremarkable.  Lymphadenopathy:       Head (right side): No submental, no submandibular, no tonsillar, no posterior auricular and no occipital adenopathy present.       Head (left side): No submental, no submandibular, no tonsillar,  no posterior auricular and no occipital adenopathy present.    He has no cervical adenopathy.    He has no axillary adenopathy.       Right: No inguinal and no supraclavicular adenopathy present.       Left: No inguinal and no supraclavicular adenopathy present.  Neurological: He is alert and oriented to person, place, and time. He has normal strength. He displays no atrophy and no tremor. No cranial nerve deficit or sensory deficit. He exhibits normal muscle tone. He displays a negative Romberg sign. Coordination and gait normal.  DTRs difficult to illicit.  Skin: Skin is warm, dry and intact. No ecchymosis and no rash noted. He is not diaphoretic. No cyanosis or erythema. Nails show no clubbing.  Psychiatric: He has a normal mood and affect. His speech is normal and behavior is normal. Judgment normal. Cognition and memory are normal.    Results for orders placed in visit on 11/20/13  POCT GLYCOSYLATED HEMOGLOBIN (HGB A1C)      Result Value Ref Range   Hemoglobin A1C 5.1        Assessment & Plan:  Routine general medical examination at a health care facility - Plan: POCT glycosylated hemoglobin (Hb A1C), Hepatitis C antibody, Lipid panel, COMPLETE METABOLIC PANEL WITH GFR, Thyroid Panel With TSH, CANCELED: Vit D  25 hydroxy (rtn osteoporosis monitoring)  Unspecified essential hypertension - Continue current medication; encouraged TLCs/ weight loss. Plan: Lipid panel, COMPLETE METABOLIC PANEL WITH GFR, Thyroid Panel With TSH, Vitamin D, 25-hydroxy  Dependent edema - Pt has compression hose to wear and edema resolves w/ elevation. Plan: POCT glycosylated hemoglobin (Hb A1C), Lipid panel, COMPLETE METABOLIC PANEL WITH GFR, Thyroid Panel With TSH, Vitamin D, 25-hydroxy  Severe obesity (BMI >= 40) - Encouraged portion size reduction and improved nutrition w/ regular fitness activities.       Plan: Thyroid Panel With TSH, Vitamin D, 25-hydroxy  Meds ordered this encounter  Medications  .  atenolol-chlorthalidone (TENORETIC) 50-25 MG per tablet    Sig: Take 1 tablet by mouth daily.    Dispense:  90 tablet    Refill:  3

## 2013-11-21 LAB — LIPID PANEL
Cholesterol: 160 mg/dL (ref 0–200)
HDL: 37 mg/dL — ABNORMAL LOW (ref >39)
LDL CALC: 109 mg/dL — AB (ref 0–99)
TRIGLYCERIDES: 71 mg/dL (ref <150)
Total CHOL/HDL Ratio: 4.3 Ratio
VLDL: 14 mg/dL (ref 0–40)

## 2013-11-21 LAB — COMPLETE METABOLIC PANEL WITH GFR
ALT: 14 U/L (ref 0–53)
AST: 18 U/L (ref 0–37)
Albumin: 4.4 g/dL (ref 3.5–5.2)
Alkaline Phosphatase: 47 U/L (ref 39–117)
BUN: 14 mg/dL (ref 6–23)
CALCIUM: 9.8 mg/dL (ref 8.4–10.5)
CHLORIDE: 105 meq/L (ref 96–112)
CO2: 25 meq/L (ref 19–32)
CREATININE: 1.04 mg/dL (ref 0.50–1.35)
GFR, EST NON AFRICAN AMERICAN: 82 mL/min
GLUCOSE: 88 mg/dL (ref 70–99)
Potassium: 4.1 mEq/L (ref 3.5–5.3)
Sodium: 140 mEq/L (ref 135–145)
TOTAL PROTEIN: 6.9 g/dL (ref 6.0–8.3)
Total Bilirubin: 1.2 mg/dL (ref 0.2–1.2)

## 2013-11-21 LAB — HEPATITIS C ANTIBODY: HCV AB: NEGATIVE

## 2013-11-21 LAB — THYROID PANEL WITH TSH
Free Thyroxine Index: 2.5 (ref 1.0–3.9)
T3 UPTAKE: 34.7 % (ref 22.5–37.0)
T4, Total: 7.3 ug/dL (ref 5.0–12.5)
TSH: 3.184 u[IU]/mL (ref 0.350–4.500)

## 2013-11-21 LAB — VITAMIN D 25 HYDROXY (VIT D DEFICIENCY, FRACTURES): Vit D, 25-Hydroxy: 29 ng/mL — ABNORMAL LOW (ref 30–89)

## 2014-01-14 ENCOUNTER — Ambulatory Visit: Payer: 59 | Admitting: Neurology

## 2014-02-24 NOTE — Sleep Study (Signed)
See media tab for report.

## 2014-08-02 ENCOUNTER — Ambulatory Visit (INDEPENDENT_AMBULATORY_CARE_PROVIDER_SITE_OTHER): Payer: BLUE CROSS/BLUE SHIELD | Admitting: Physician Assistant

## 2014-08-02 VITALS — BP 130/80 | HR 82 | Temp 98.2°F | Resp 16 | Ht 71.0 in | Wt 301.0 lb

## 2014-08-02 DIAGNOSIS — N508 Other specified disorders of male genital organs: Secondary | ICD-10-CM

## 2014-08-02 DIAGNOSIS — N5089 Other specified disorders of the male genital organs: Secondary | ICD-10-CM

## 2014-08-02 DIAGNOSIS — Z125 Encounter for screening for malignant neoplasm of prostate: Secondary | ICD-10-CM

## 2014-08-02 NOTE — Progress Notes (Addendum)
[facility] [address], [place] [address] [phone]  Date:  08/02/2014   Name:  Steven Simpson   DOB:  1961/05/08   MRN:  [id number]  PCP:  Dow AdolphMCPHERSON,[name], MD    Chief Complaint: Mass   History of Present Illness:  Steven Stakesathaniel Hingle is a 54 y.o. very pleasant male with PMH of prostate hypertrophy and vasectomy who presents with the following: Patient states that he found a testicular mass along the back side of his scotal sac, while he was bathing 1 week ago.  He states it was small in size, and did not go away, though may feel smaller today.  It feels hard.  It is not mobile.  He denies any urinary symptoms of polyuria, dysuria, hematuria.  He denies any abnormal discharge, sexual dysfunction.  He has no hx of constipation and states he has normal bowel movements.   No weight loss, nausea, fever, sob, or dyspnea.     Patient Active Problem List   Diagnosis Date Noted  . OSA (obstructive sleep apnea) 02/18/2013  . Unspecified essential hypertension 11/14/2012  . Hypertrophy of prostate without urinary obstruction and other lower urinary tract symptoms (LUTS) 11/14/2012  . Severe obesity (BMI >= 40) 11/14/2012    Past Medical History  Diagnosis Date  . Pre-hypertension   . Hypertension     Past Surgical History  Procedure Laterality Date  . Vasectomy  2012    History  Substance Use Topics  . Smoking status: Never Smoker   . Smokeless tobacco: Never Used  . Alcohol Use: No    Family History  Problem Relation Age of Onset  . Dementia Mother     early stage  . Heart disease Father   . Cancer Sister     throat  . Diabetes Brother     diabetes type 1    No Known Allergies  Medication list has been reviewed and updated.  Current Outpatient Prescriptions on File Prior to Visit  Medication Sig Dispense Refill  . atenolol-chlorthalidone (TENORETIC) 50-25 MG per tablet Take 1 tablet by mouth daily. 90 tablet 3   No current  facility-administered medications on file prior to visit.    Review of Systems: ROS otherwise unremarkable unless mentioned above.    Physical Examination: Filed Vitals:   08/02/14 0812  BP: 130/80  Pulse: 82  Temp: 98.2 F (36.8 C)  Resp: 16   Filed Vitals:   08/02/14 0812  Height: 5\' 11"  (1.803 m)  Weight: 301 lb (136.533 kg)   Body mass index is 42 kg/(m^2). Ideal Body Weight: Weight in (lb) to have BMI = 25: 178.9  Physical Exam  Constitutional: He appears well-developed and well-nourished. No distress.  HENT:  Head: Normocephalic and atraumatic.  Eyes: EOM are normal. Pupils are equal, round, and reactive to light.  Cardiovascular: Normal rate.   Pulmonary/Chest: Effort normal. No respiratory distress. He has no wheezes.  Genitourinary: Testes normal and penis normal. Right testis shows no mass and no tenderness. Cremasteric reflex is not absent on the right side. Left testis shows no mass and no tenderness. Cremasteric reflex is not absent on the left side.  Mass was located by patient.  The mass appreciated was consistent as the epididymis.  I could not locate a more specific mass along the testicles or along and proximal anatomy.  Psychiatric: He has a normal mood and affect. His behavior is normal.     Assessment and Plan: 54 year old male  is here today for chief complaint of testicular mass.  With palpation, I could appreciate the epididymis, but mass not apparent on physical exam.  Will do markers, and ultrasound.  Possibility this is a spermatocele given vasectomy hx.    Testicular mass - Plan: AFP tumor marker, HCG, Tumor Marker, US Scrotum  Screening for prostate cancer - Plan: PSA  Trena Platt, PA-C [facility] 2/29/20164:47 PM

## 2014-08-02 NOTE — Patient Instructions (Signed)
It was lovely meeting you.  I will be in contact with you about your lab results.  Referral will contact you on your appointment.

## 2014-08-03 LAB — PSA: PSA: 0.91 ng/mL (ref <=4.00)

## 2014-08-03 LAB — AFP TUMOR MARKER: AFP TUMOR MARKER: 1.4 ng/mL (ref <6.1)

## 2014-08-05 ENCOUNTER — Ambulatory Visit
Admission: RE | Admit: 2014-08-05 | Discharge: 2014-08-05 | Disposition: A | Discharge status: Home / Self Care | Payer: BLUE CROSS/BLUE SHIELD | Source: Ambulatory Visit | Attending: Physician Assistant | Admitting: Physician Assistant

## 2014-08-05 LAB — BETA HCG QUANT (REF LAB)

## 2014-11-09 ENCOUNTER — Ambulatory Visit (INDEPENDENT_AMBULATORY_CARE_PROVIDER_SITE_OTHER): Payer: BLUE CROSS/BLUE SHIELD | Admitting: Internal Medicine

## 2014-11-09 VITALS — BP 142/64 | HR 70 | Temp 98.8°F | Resp 12 | Ht 71.75 in | Wt 306.8 lb

## 2014-11-09 DIAGNOSIS — L918 Other hypertrophic disorders of the skin: Secondary | ICD-10-CM

## 2014-11-09 DIAGNOSIS — T148XXA Other injury of unspecified body region, initial encounter: Secondary | ICD-10-CM

## 2014-11-09 DIAGNOSIS — R238 Other skin changes: Secondary | ICD-10-CM | POA: Diagnosis not present

## 2014-11-09 DIAGNOSIS — W57XXXA Bitten or stung by nonvenomous insect and other nonvenomous arthropods, initial encounter: Secondary | ICD-10-CM | POA: Diagnosis not present

## 2014-11-09 DIAGNOSIS — S30860A Insect bite (nonvenomous) of lower back and pelvis, initial encounter: Secondary | ICD-10-CM | POA: Diagnosis not present

## 2014-11-09 MED ORDER — MUPIROCIN 2 % EX OINT: 1.0000 "application " | TOPICAL_OINTMENT | Freq: Two times a day (BID) | CUTANEOUS | Status: DC

## 2014-11-09 MED ORDER — DOXYCYCLINE HYCLATE 100 MG PO TABS: 100.0000 mg | ORAL_TABLET | Freq: Two times a day (BID) | ORAL | Status: DC

## 2014-11-09 NOTE — Patient Instructions (Signed)
Tick Bite Information Ticks are insects that attach themselves to the skin and draw blood for food. There are various types of ticks. Common types include wood ticks and deer ticks. Most ticks live in shrubs and grassy areas. Ticks can climb onto your body when you make contact with leaves or grass where the tick is waiting. The most common places on the body for ticks to attach themselves are the scalp, neck, armpits, waist, and groin. Most tick bites are harmless, but sometimes ticks carry germs that cause diseases. These germs can be spread to a person during the tick's feeding process. The chance of a disease spreading through a tick bite depends on:   The type of tick.  Time of year.   How long the tick is attached.   Geographic location.  HOW CAN YOU PREVENT TICK BITES? Take these steps to help prevent tick bites when you are outdoors:  Wear protective clothing. Long sleeves and long pants are best.   Wear white clothes so you can see ticks more easily.  Tuck your pant legs into your socks.   If walking on a trail, stay in the middle of the trail to avoid brushing against bushes.  Avoid walking through areas with long grass.  Put insect repellent on all exposed skin and along boot tops, pant legs, and sleeve cuffs.   Check clothing, hair, and skin repeatedly and before going inside.   Brush off any ticks that are not attached.  Take a shower or bath as soon as possible after being outdoors.  WHAT IS THE PROPER WAY TO REMOVE A TICK? Ticks should be removed as soon as possible to help prevent diseases caused by tick bites. 1. If latex gloves are available, put them on before trying to remove a tick.  2. Using fine-point tweezers, grasp the tick as close to the skin as possible. You may also use curved forceps or a tick removal tool. Grasp the tick as close to its head as possible. Avoid grasping the tick on its body. 3. Pull gently with steady upward pressure until  the tick lets go. Do not twist the tick or jerk it suddenly. This may break off the tick's head or mouth parts. 4. Do not squeeze or crush the tick's body. This could force disease-carrying fluids from the tick into your body.  5. After the tick is removed, wash the bite area and your hands with soap and water or other disinfectant such as alcohol. 6. Apply a small amount of antiseptic cream or ointment to the bite site.  7. Wash and disinfect any instruments that were used.  Do not try to remove a tick by applying a hot match, petroleum jelly, or fingernail polish to the tick. These methods do not work and may increase the chances of disease being spread from the tick bite.  WHEN SHOULD YOU SEEK MEDICAL CARE? Contact your health care provider if you are unable to remove a tick from your skin or if a part of the tick breaks off and is stuck in the skin.  After a tick bite, you need to be aware of signs and symptoms that could be related to diseases spread by ticks. Contact your health care provider if you develop any of the following in the days or weeks after the tick bite:  Unexplained fever.  Rash. A circular rash that appears days or weeks after the tick bite may indicate the possibility of Lyme disease. The rash may resemble   a target with a bull's-eye and may occur at a different part of your body than the tick bite.  Redness and swelling in the area of the tick bite.   Tender, swollen lymph glands.   Diarrhea.   Weight loss.   Cough.   Fatigue.   Muscle, joint, or bone pain.   Abdominal pain.   Headache.   Lethargy or a change in your level of consciousness.  Difficulty walking or moving your legs.   Numbness in the legs.   Paralysis.  Shortness of breath.   Confusion.   Repeated vomiting.  Document Released: 05/18/2000 Document Revised: 03/11/2013 Document Reviewed: 10/29/2012 [employer] Patient Information 2015 [employer]. This information is  not intended to replace advice given to you by your health care provider. Make sure you discuss any questions you have with your health care provider.  

## 2014-11-09 NOTE — Progress Notes (Signed)
   Subjective:    Patient ID: Shea Stakesathaniel Mignone, male    DOB: [birth year], 54 y.o.   MRN: [id number]  HPI I, Trenda MootsMichelle Farrington R.T. (R), am scribing for Dr. Robert Bellowhris Guest M.D.  Gardiner Barefootathaniel has presented today with a tick bite on his lower back which he has noticed 2 weeks ago. He states it has gotten bigger over time. He denies fever, fatigue, nausea, vomiting, or drainage from that area. He also complains of several skin tags on his back and neck. We will remove these.    Review of Systems     Objective:   Physical Exam  Constitutional: He is oriented to person, place, and time. He appears well-developed and well-nourished.  HENT:  Head: Normocephalic.  Eyes: EOM are normal. Pupils are equal, round, and reactive to light.  Neck: Normal range of motion. Neck supple.  Cardiovascular: Normal rate.   Pulmonary/Chest: Effort normal.  Abdominal: Soft.  Musculoskeletal: Normal range of motion.  Neurological: He is alert and oriented to person, place, and time. He exhibits normal muscle tone. Coordination normal.  Skin: Skin is intact. Purpura and rash noted.     Common skin tags, 4, uncomplicated  Sterile prep, anesthetic local, tags clipped off, cautery. All by Trena PlattStephanie English PA c          Assessment & Plan:  Doxycycline/Wound care

## 2014-11-17 ENCOUNTER — Other Ambulatory Visit: Payer: Self-pay | Admitting: Family Medicine

## 2016-08-14 IMAGING — US US SCROTUM
1 series · 14 of 25 positions shown · non-contrast
Comparison: None.

CLINICAL DATA: Nontender palpable area along posterior mid scrotum
x1 week

EXAM:
ULTRASOUND OF SCROTUM
TECHNIQUE: Complete ultrasound examination of the testicles, epididymis, and
other scrotal structures was performed.

[Series 1: us scrotum · 0.08mm/px · 80 acquisitions, 14 frames shown]
[im 1/80]
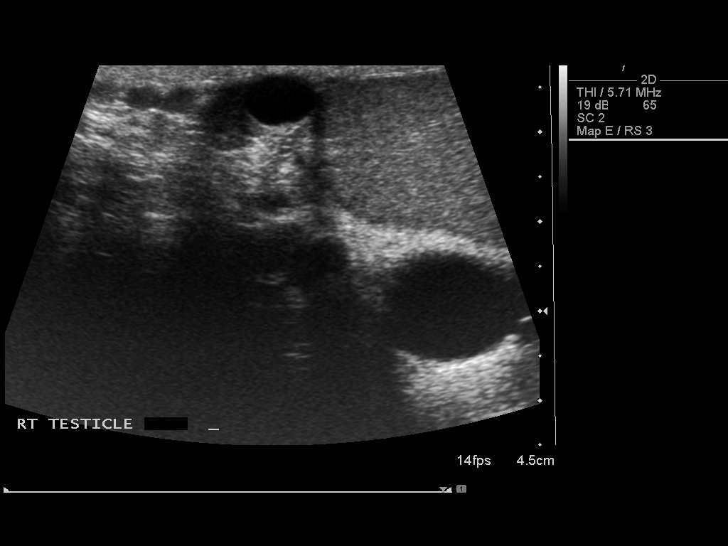
[im 7/80]
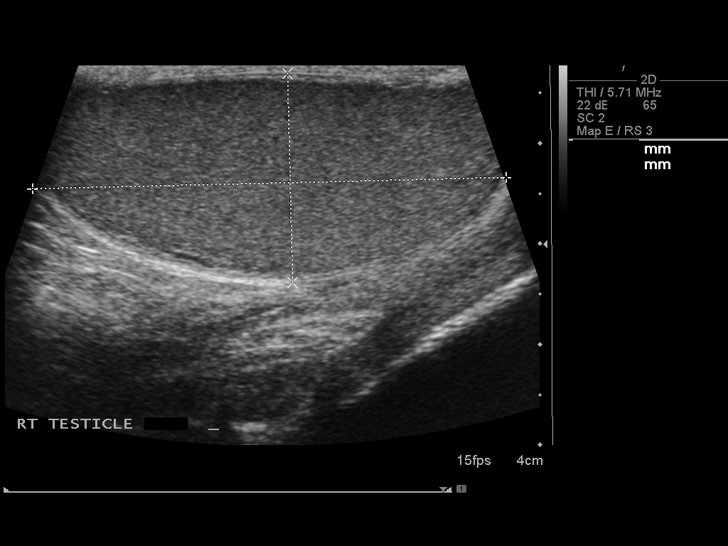
[im 14/80]
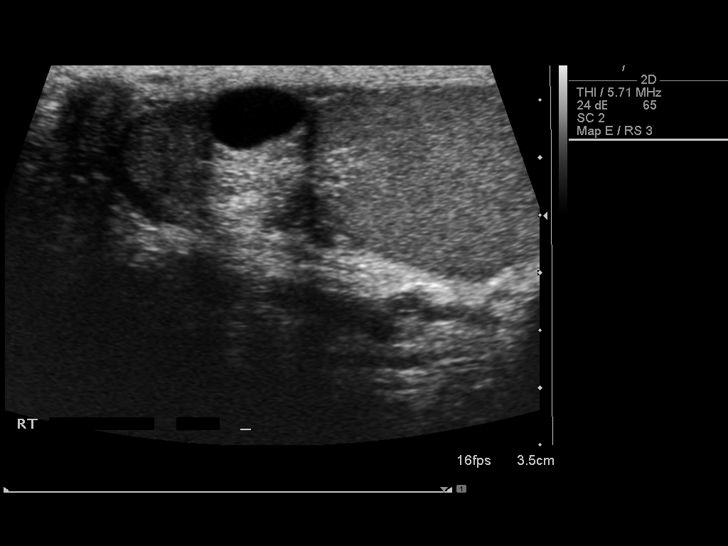
[im 20/80]
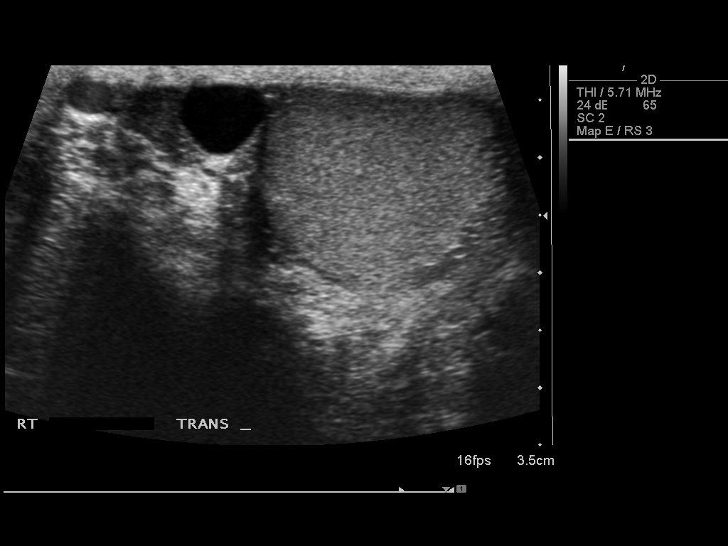
[im 27/80]
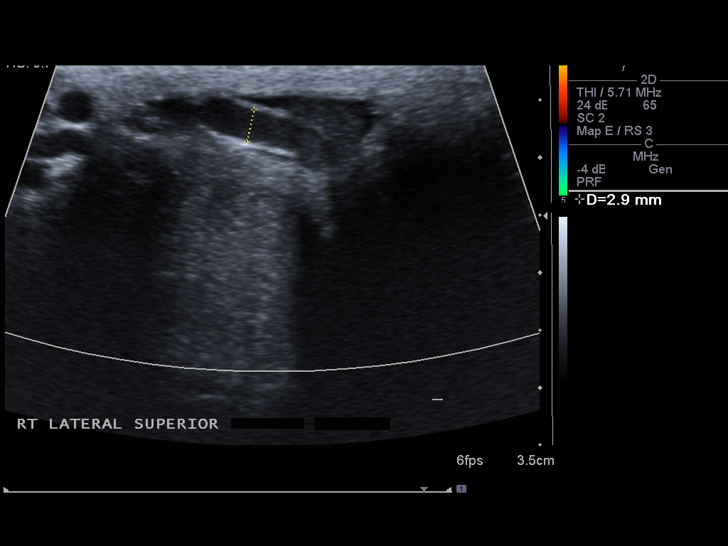
[im 30/80]
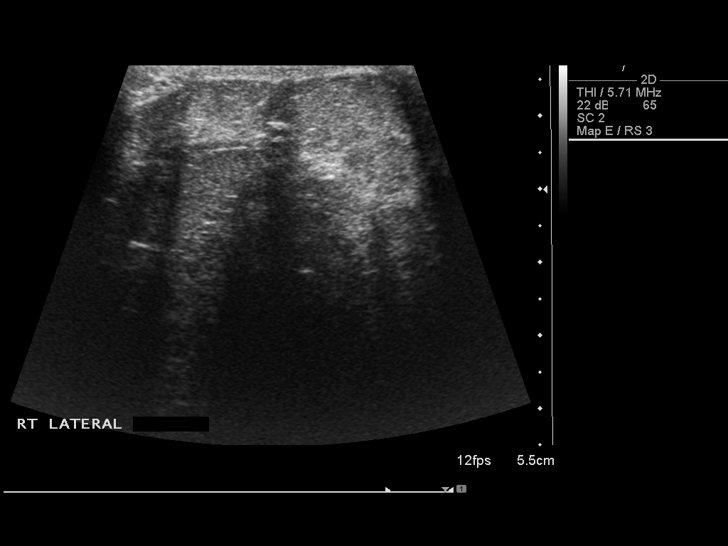
[im 37/80]
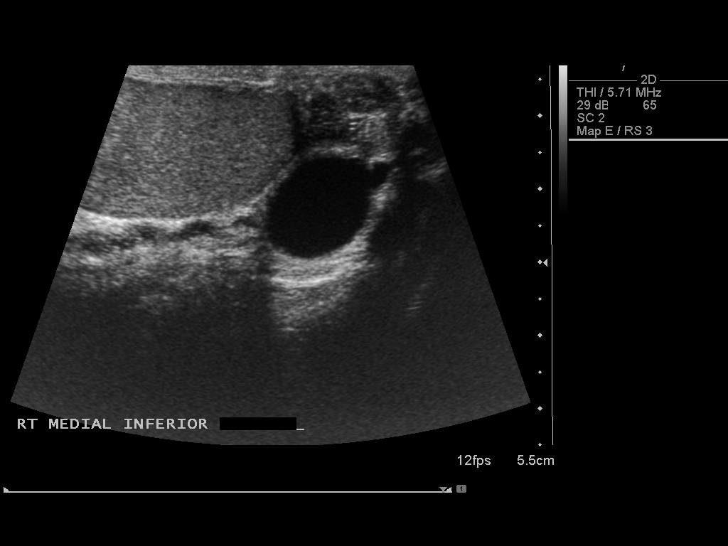
[im 43/80]
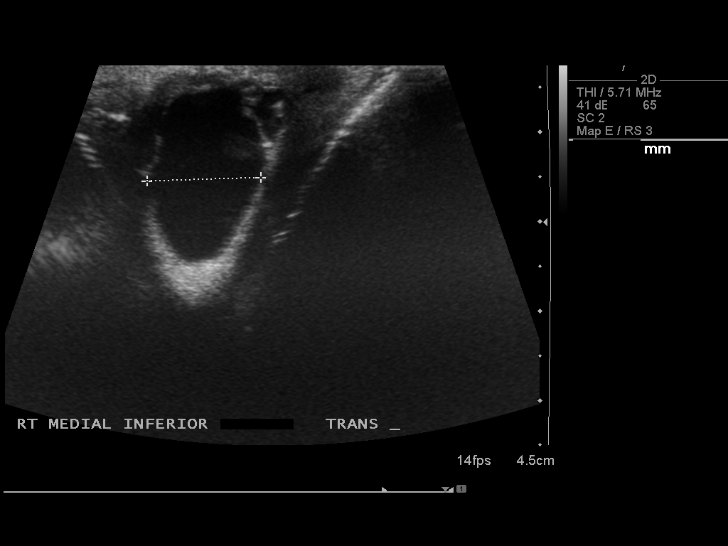
[im 50/80]
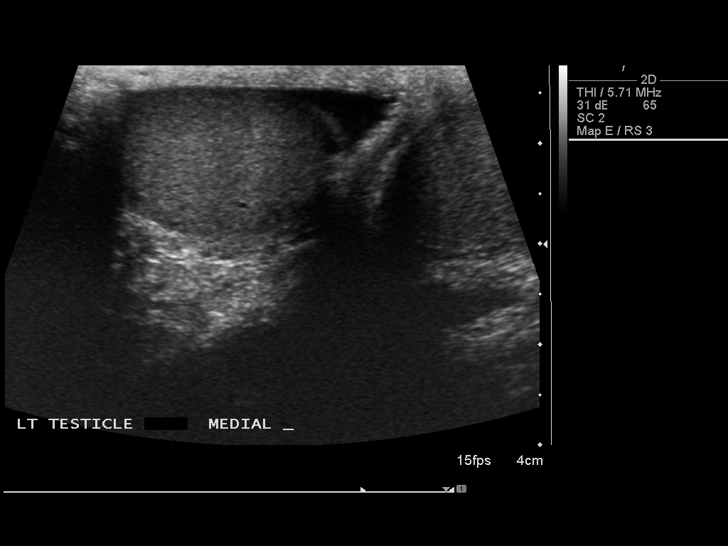
[im 53/80]
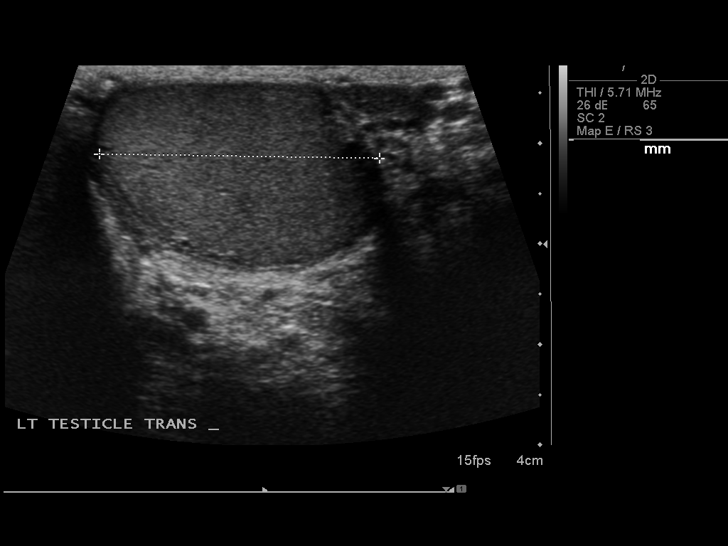
[im 60/80]
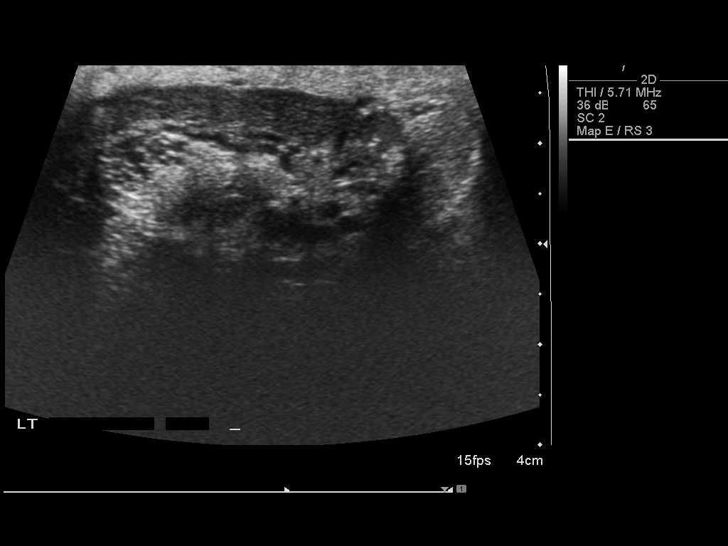
[im 66/80]
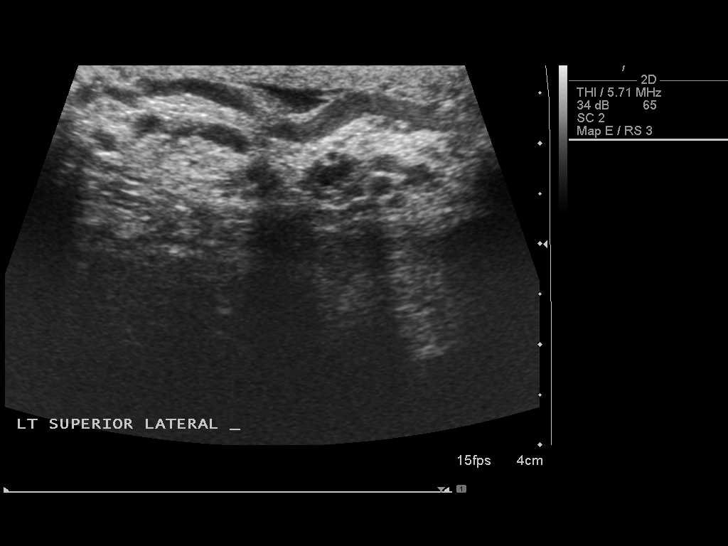
[im 73/80]
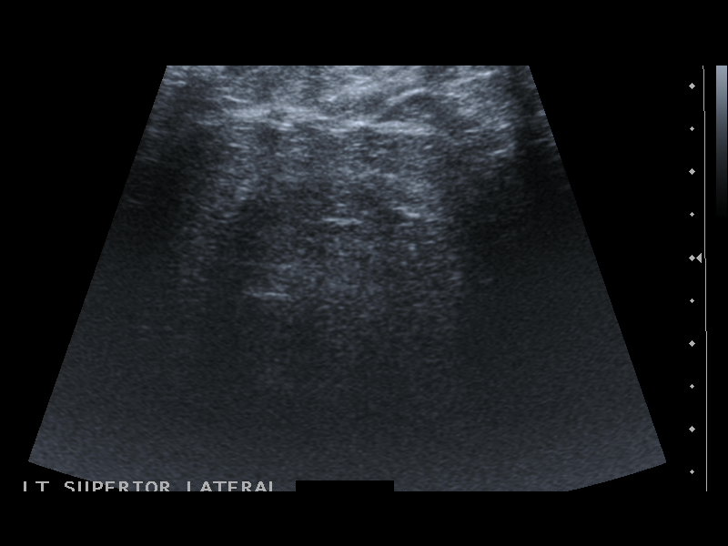
[im 80/80]
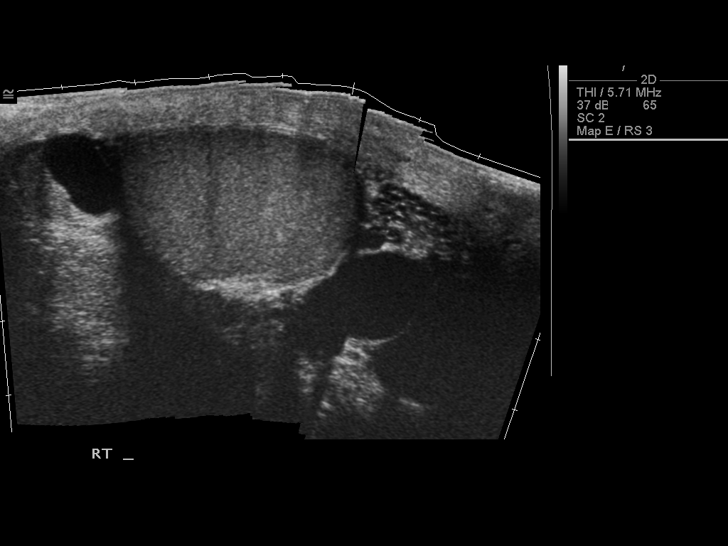

[14 of 25 positions shown; findings below may reference images not displayed]

FINDINGS: Right testicle

Measurements: 4.7 x 2.2 x 3.1 cm. No mass or microlithiasis
visualized.

Left testicle

Measurements: 4.7 x 2.0 x 2.8 cm. No mass or microlithiasis
visualized.

Right epididymis: Multiple epididymal head cysts, measuring up to
1.4 x 0.7 x 0.8 cm.

Left epididymis:  Normal in size and appearance.

Hydrocele:  None visualized.

Varicocele:  Present bilaterally

Additional comments: Along the inferomedial right scrotum,
corresponding to the palpable abnormality, are two adjacent cystic
lesions measuring 2.2 x 1.2 x 1.3 cm and 1.5 x 1.6 x 1.7 cm, favored
to reflect spermatoceles, less likely epididymal tail cysts.
IMPRESSION: Palpable abnormality corresponds to two cystic lesions measuring up
to 2.2 cm, likely spermatoceles, less likely epididymal tail cysts.

Bilateral varicoceles.

Normal sonographic appearance of the bilateral testes.

## 2017-09-26 ENCOUNTER — Encounter: Payer: Self-pay | Admitting: Family Medicine

## 2017-09-26 ENCOUNTER — Ambulatory Visit (INDEPENDENT_AMBULATORY_CARE_PROVIDER_SITE_OTHER): Payer: Managed Care, Other (non HMO) | Admitting: Family Medicine

## 2017-09-26 ENCOUNTER — Other Ambulatory Visit: Payer: Self-pay

## 2017-09-26 VITALS — BP 134/88 | HR 98 | Temp 98.5°F | Ht 70.0 in | Wt 314.4 lb

## 2017-09-26 DIAGNOSIS — Z131 Encounter for screening for diabetes mellitus: Secondary | ICD-10-CM | POA: Diagnosis not present

## 2017-09-26 DIAGNOSIS — Z6841 Body Mass Index (BMI) 40.0 and over, adult: Secondary | ICD-10-CM

## 2017-09-26 DIAGNOSIS — Z23 Encounter for immunization: Secondary | ICD-10-CM | POA: Diagnosis not present

## 2017-09-26 DIAGNOSIS — Z9989 Dependence on other enabling machines and devices: Secondary | ICD-10-CM

## 2017-09-26 DIAGNOSIS — I10 Essential (primary) hypertension: Secondary | ICD-10-CM | POA: Diagnosis not present

## 2017-09-26 DIAGNOSIS — G4733 Obstructive sleep apnea (adult) (pediatric): Secondary | ICD-10-CM

## 2017-09-26 DIAGNOSIS — Z13220 Encounter for screening for lipoid disorders: Secondary | ICD-10-CM | POA: Diagnosis not present

## 2017-09-26 DIAGNOSIS — Z125 Encounter for screening for malignant neoplasm of prostate: Secondary | ICD-10-CM

## 2017-09-26 DIAGNOSIS — Z0000 Encounter for general adult medical examination without abnormal findings: Secondary | ICD-10-CM

## 2017-09-26 MED ORDER — CHLORTHALIDONE 25 MG PO TABS: 25.0000 mg | ORAL_TABLET | Freq: Every day | ORAL | 1 refills | Status: DC

## 2017-09-26 MED ORDER — ZOSTER VAC RECOMB ADJUVANTED 50 MCG/0.5ML IM SUSR: 0.5000 mL | Freq: Once | INTRAMUSCULAR | 1 refills | Status: AC

## 2017-09-26 NOTE — Patient Instructions (Addendum)
Restart CPAP machine for sleep apnea. If there are fit issues, can discuss with medical device supplier or [facility].   Restart blood pressure med with chlorthalidone alone. Keep a record of your blood pressures outside of the office and if running over 140/90, can change meds. Recheck in next 6 weeks for blood pressure.   See information below on leg swelling.  Elevate legs when you are able, restarting chlorthalidone should help.  For weight loss work on avoiding fast food, sugar-containing beverages, and portion control.  Additionally exercise most days per week with a goal of 150 minutes/week to begin with.  As we discussed I am happy to refer you to nutritionist or bariatric specialist if needed, but agree with attempting weight loss with exercise and diet for now.  I will check blood work as we discussed, shingles vaccine was sent to your pharmacy, tetanus was given today.  Thank you for coming in today.    Peripheral Edema Peripheral edema is swelling that is caused by a buildup of fluid. Peripheral edema most often affects the lower legs, ankles, and feet. It can also develop in the arms, hands, and face. The area of the body that has peripheral edema will look swollen. It may also feel heavy or warm. Your clothes may start to feel tight. Pressing on the area may make a temporary dent in your skin. You may not be able to move your arm or leg as much as usual. There are many causes of peripheral edema. It can be a complication of other diseases, such as congestive heart failure, kidney disease, or a problem with your blood circulation. It also can be a side effect of certain medicines. It often happens to women during pregnancy. Sometimes, the cause is not known. Treating the underlying condition is often the only treatment for peripheral edema. Follow these instructions at home: Pay attention to any changes in your symptoms. Take these actions to help with your discomfort:  Raise  (elevate) your legs while you are sitting or lying down.  Move around often to prevent stiffness and to lessen swelling. Do not sit or stand for long periods of time.  Wear support stockings as told by your health care provider.  Follow instructions from your health care provider about limiting salt (sodium) in your diet. Sometimes eating less salt can reduce swelling.  Take over-the-counter and prescription medicines only as told by your health care provider. Your health care provider may prescribe medicine to help your body get rid of excess water (diuretic).  Keep all follow-up visits as told by your health care provider. This is important.  Contact a health care provider if:  You have a fever.  Your edema starts suddenly or is getting worse, especially if you are pregnant or have a medical condition.  You have swelling in only one leg.  You have increased swelling and pain in your legs. Get help right away if:  You develop shortness of breath, especially when you are lying down.  You have pain in your chest or abdomen.  You feel weak.  You faint. This information is not intended to replace advice given to you by your health care provider. Make sure you discuss any questions you have with your health care provider. Document Released: 06/28/2004 Document Revised: 10/24/2015 Document Reviewed: 12/01/2014 Elsevier Interactive Patient Education  2018 [employer].   Keeping you healthy  Get these tests  Blood pressure- Have your blood pressure checked once a year by  your healthcare provider.  Normal blood pressure is 120/80  Weight- Have your body mass index (BMI) calculated to screen for obesity.  BMI is a measure of body fat based on height and weight. You can also calculate your own BMI at [email or web address]  Cholesterol- Have your cholesterol checked every year.  Diabetes- Have your blood sugar checked regularly if you have high blood pressure, high  cholesterol, have a family history of diabetes or if you are overweight.  Screening for Colon Cancer- Colonoscopy starting at age 56.  Screening may begin sooner depending on your family history and other health conditions. Follow up colonoscopy as directed by your Gastroenterologist.  Screening for Prostate Cancer- Both blood work (PSA) and a rectal exam help screen for Prostate Cancer.  Screening begins at age 10640 with African-American men and at age 56 with Caucasian men.  Screening may begin sooner depending on your family history.  Take these medicines  Aspirin- One aspirin daily can help prevent Heart disease and Stroke.  Flu shot- Every fall.  Tetanus- Every 10 years.  Zostavax- Once after the age of 56 to prevent Shingles.  Pneumonia shot- Once after the age of 56; if you are younger than 2465, ask your healthcare provider if you need a Pneumonia shot.  Take these steps  Don't smoke- If you do smoke, talk to your doctor about quitting.  For tips on how to quit, go to www.smokefree.gov or call 1-800-QUIT-NOW.  Be physically active- Exercise 5 days a week for at least 30 minutes.  If you are not already physically active start slow and gradually work up to 30 minutes of moderate physical activity.  Examples of moderate activity include walking briskly, mowing the yard, dancing, swimming, bicycling, etc.  Eat a healthy diet- Eat a variety of healthy food such as fruits, vegetables, low fat milk, low fat cheese, yogurt, lean meant, poultry, fish, beans, tofu, etc. For more information go to www.thenutritionsource.org  Drink alcohol in moderation- Limit alcohol intake to less than two drinks a day. Never drink and drive.  Dentist- Brush and floss twice daily; visit your dentist twice a year.  Depression- Your emotional health is as important as your physical health. If you're feeling down, or losing interest in things you would normally enjoy please talk to your healthcare  provider.  Eye exam- Visit your eye doctor every year.  Safe sex- If you may be exposed to a sexually transmitted infection, use a condom.  Seat belts- Seat belts can save your life; always wear one.  Smoke/Carbon Monoxide detectors- These detectors need to be installed on the appropriate level of your home.  Replace batteries at least once a year.  Skin cancer- When out in the sun, cover up and use sunscreen 15 SPF or higher.  Violence- If anyone is threatening you, please tell your healthcare provider.  Living Will/ Health care power of attorney- Speak with your healthcare provider and family.    IF you received an x-ray today, you will receive an invoice from [facility]. Please contact [facility] at [phone] with questions or concerns regarding your invoice.   IF you received labwork today, you will receive an invoice from [facility]. Please contact [facility] at [phone] with questions or concerns regarding your invoice.   Our billing staff will not be able to assist you with questions regarding bills from these companies.  You will be contacted with the lab results as soon as they are available. The fastest way to get your results  is to activate your My Chart account. Instructions are located on the last page of this paperwork. If you have not heard from us regarding the results in 2 weeks, please contact this office.     

## 2017-09-26 NOTE — Progress Notes (Signed)
By signing my name below, I, Schuyler Bain, attest that this documentation has been prepared under the direction and in the presence of Dr. Asencion Partridge. Neva Seat.   Electronically Signed: Marcelline Mates, Medical Scribe 09/26/2017 at 8:16 AM.  Subjective:    Patient ID: Steven Simpson, male    DOB: 17-Aug-1960, 57 y.o.   MRN: [id number] Chief Complaint  Patient presents with  . Annual Exam    HPI Steven Simpson is a 57 y.o. male who presents to Primary Care at [facility] and has a hx of HTN, BPH, obesity, and obstructive sleep apnea. Previous physical in 2016 with Dr. Audria Nine.   Hypertension: Previously took Tenoretic 50/25mg  qd, but his medications ran out a year ago. He denies any barriers to getting the care he needs, but chose not to come back in for a med refill. He notes that he has checked his BP at home occasionally and the highest reading he recalls has been 140s/90s. He denies light headedness or dizziness.   Lab Results  Component Value Date   CREATININE 1.04 11/20/2013   BP Readings from Last 3 Encounters:  09/26/17 134/88  11/09/14 (!) 142/64  08/02/14 130/80    Sleep Apnea: History of CPAP usage, but pt has not been using it lately due to it being uncomfortable and feeling as though he hasn't needed it because he has been sleeping well. AHI of 17 as of September 2016 with Dr. Frances Furbish.   Obesity: Body mass index is 45.11 kg/m. Wt Readings from Last 3 Encounters:  09/26/17 (!) 314 lb 6.4 oz (142.6 kg)  11/09/14 (!) 306 lb 12.8 oz (139.2 kg)  08/02/14 (!) 301 lb (136.5 kg)   He was not exercising at last physical. He did have peripheral edema, compression stockings were discussed previously. He reports that his legs continue to swell as the day goes on and he denies using compression socks. He denies any concern for diabetes at this time. The pt notes that he does not eat many sweets but doesn't eat enough healthy options. The pt notes that he eats breakfast every day and  wants to try lifestyle modifications in the next 6 weeks.    Testicular abnormality: See previous evaluation. Reviewed [place] results again in office. Denies any change in size or any new lesions.   Cancer Screening: -Colonoscopy: August 2014 with Dr. Elnoria Howard, normal repeat recommended in 10 years.  -Prostate: Hx of enlarged prostate. The pt notes that he saw a urologist previously for a vasectomy.   Lab Results  Component Value Date   PSA 0.91 08/02/2014   PSA 0.66 11/14/2012    Immunizations: Immunization History  Administered Date(s) Administered  . Tdap 06/05/2007   Discussed Shingles vaccine with pt who denies ever having a shingles vaccine; he reports wanting to have this vaccine.  Pt is due for a new Tdap today and is agreeable to receiving this.    Depression Screen: Depression screen [facility] 2/9 09/26/2017 11/09/2014 11/20/2013  Decreased Interest 0 0 0  Down, Depressed, Hopeless 0 0 0  PHQ - 2 Score 0 0 0    Vision Screen:  Visual Acuity Screening   Right eye Left eye Both eyes  Without correction: 20/20 20/25-3 20/15-1  With correction:      The pt has not seen an eye doctor in the last year, but has seen one in the last 4 years.   Dentist: The pt continues to see his dentist every 6 months.   Exercise: The pt denies  getting any regular exercise at this time.    Patient Active Problem List   Diagnosis Date Noted  . OSA (obstructive sleep apnea) 02/18/2013  . Unspecified essential hypertension 11/14/2012  . Hypertrophy of prostate without urinary obstruction and other lower urinary tract symptoms (LUTS) 11/14/2012  . Severe obesity (BMI >= 40) (HCC) 11/14/2012   Past Medical History:  Diagnosis Date  . Hypertension   . Pre-hypertension    Past Surgical History:  Procedure Laterality Date  . VASECTOMY  2012   No Known Allergies Prior to Admission medications   Medication Sig Start Date End Date Taking? Authorizing Provider  atenolol-chlorthalidone  (TENORETIC) 50-25 MG per tablet TAKE 1 TABLET BY MOUTH ONCE DAILY.  "OV NEEDED FOR FURTHER REFILLS" 11/17/14   Valarie ConesWeber, Dema SeverinSarah L, PA-C  doxycycline (VIBRA-TABS) 100 MG tablet Take 1 tablet (100 mg total) by mouth 2 (two) times daily. 11/09/14   Jonita AlbeeGuest, Chris W, MD  mupirocin ointment (BACTROBAN) 2 % Apply 1 application topically 2 (two) times daily. 11/09/14   Jonita AlbeeGuest, Chris W, MD   Social History   Socioeconomic History  . Marital status: Married    Spouse name: Britta MccreedyBarbara  . Number of children: 2  . Years of education: MA  . Highest education level: Not on file  Occupational History  . Occupation: COMPUTER ANALYST    Employer: [employer]    Employer: [employer]  Social Needs  . Financial resource strain: Not on file  . Food insecurity:    Worry: Not on file    Inability: Not on file  . Transportation needs:    Medical: Not on file    Non-medical: Not on file  Tobacco Use  . Smoking status: Never Smoker  . Smokeless tobacco: Never Used  Substance and Sexual Activity  . Alcohol use: No  . Drug use: No  . Sexual activity: Yes    Comment: number of sex partners in the last 12 months 1    [name]  Lifestyle  . Physical activity:    Days per week: Not on file    Minutes per session: Not on file  . Stress: Not on file  Relationships  . Social connections:    Talks on phone: Not on file    Gets together: Not on file    Attends religious service: Not on file    Active member of club or organization: Not on file    Attends meetings of clubs or organizations: Not on file    Relationship status: Not on file  . Intimate partner violence:    Fear of current or ex partner: Not on file    Emotionally abused: Not on file    Physically abused: Not on file    Forced sexual activity: Not on file  Other Topics Concern  . Not on file  Social History Narrative   Patient lives at home with spouse.   Caffeine Use: 1 soda weekly    Review of Systems  Constitutional: Negative for  chills, fatigue and fever.  Cardiovascular: Positive for leg swelling.  Gastrointestinal: Negative for constipation and diarrhea.  Neurological: Negative for dizziness and light-headedness.       Objective:   Physical Exam  Constitutional: He is oriented to person, place, and time. He appears well-developed and well-nourished. No distress.  HENT:  Head: Normocephalic and atraumatic.  Nose: Nose normal.  Mouth/Throat: Oropharynx is clear and moist and mucous membranes are normal.  Cardiovascular: Normal rate, regular rhythm and normal heart  sounds. Exam reveals no gallop and no friction rub.  No murmur heard. Pulmonary/Chest: Effort normal and breath sounds normal. No respiratory distress. He has no wheezes. He has no rales.  Abdominal: Soft. Bowel sounds are normal. He exhibits no distension. There is no tenderness.  Musculoskeletal: He exhibits edema (1-2+ pitting edema b/l lower extremities).  Neurological: He is alert and oriented to person, place, and time.  Skin: Skin is warm and dry.  Psychiatric: He has a normal mood and affect. His speech is normal and behavior is normal. Judgment and thought content normal. Cognition and memory are normal.   Vitals:   09/26/17 0808  BP: 134/88  Pulse: 98  Temp: 98.5 F (36.9 C)  TempSrc: Oral  SpO2: 94%  Weight: (!) 314 lb 6.4 oz (142.6 kg)  Height: 5\' 10"  (1.778 m)        Assessment & Plan:    Steven Simpson is a 58 y.o. male Annual physical exam  - -anticipatory guidance as below in AVS, screening labs above. Health maintenance items as above in HPI discussed/recommended as applicable.   Class 3 severe obesity with body mass index (BMI) of 45.0 to 49.9 in adult, unspecified obesity type, unspecified whether serious comorbidity present [facility])  -Not currently exercising, discussed potential for nutritionist or bariatric specialist evaluation, but would like to try initial approach of exercise and diet changes.  Avoidance of  sugar containing beverages, portion control and fast food avoidance discussed as well as activityper week with a minimum of 150 minutes.  Plans on recheck in 6 weeks, but may need to be monitored in the next 6 months for improvement in weight.    OSA on CPAP  -Recommended restarting CPAP, but if there are difficulties with fit or equipment issues can discuss with his medical supply company or sleep specialist  Screening for diabetes mellitus - Plan: Comprehensive metabolic panel, Hemoglobin A1c  Need for shingles vaccine - Plan: Zoster Vaccine Adjuvanted [facility]) injection sent to pharmacy  Need for Tdap vaccination - Plan: Tdap vaccine greater than or equal to 7yo IM  Screening for hyperlipidemia - Plan: Lipid panel  Screening for prostate cancer - Plan: PSA  -We discussed pros and cons of prostate cancer screening, and after this discussion, he chose to have screening done. PSA obtained, and no concerning findings on DRE.   Essential hypertension - Plan: chlorthalidone (HYGROTON) 25 MG tablet  -Borderline in office, do not think that he needs the combination with atenolol but will restart chlorthalidone as that should help blood pressure as well as somewhat help with the edema.  Peripheral edema discussed and likely due to weight, but can discuss further at follow-up in 6 weeks.  Encouraging that the edema improves overnight with elevation.   Meds ordered this encounter  Medications  . Zoster Vaccine Adjuvanted [facility]) injection    Sig: Inject 0.5 mLs into the muscle once for 1 dose. Repeat in 2-6 months.    Dispense:  0.5 mL    Refill:  1  . chlorthalidone (HYGROTON) 25 MG tablet    Sig: Take 1 tablet (25 mg total) by mouth daily.    Dispense:  90 tablet    Refill:  1   Patient Instructions    Restart CPAP machine for sleep apnea. If there are fit issues, can discuss with medical device supplier or [facility].   Restart blood pressure med with chlorthalidone  alone. Keep a record of your blood pressures outside of the office and  if running over 140/90, can change meds. Recheck in next 6 weeks for blood pressure.   See information below on leg swelling.  Elevate legs when you are able, restarting chlorthalidone should help.  For weight loss work on avoiding fast food, sugar-containing beverages, and portion control.  Additionally exercise most days per week with a goal of 150 minutes/week to begin with.  As we discussed I am happy to refer you to nutritionist or bariatric specialist if needed, but agree with attempting weight loss with exercise and diet for now.  I will check blood work as we discussed, shingles vaccine was sent to your pharmacy, tetanus was given today.  Thank you for coming in today.    Peripheral Edema Peripheral edema is swelling that is caused by a buildup of fluid. Peripheral edema most often affects the lower legs, ankles, and feet. It can also develop in the arms, hands, and face. The area of the body that has peripheral edema will look swollen. It may also feel heavy or warm. Your clothes may start to feel tight. Pressing on the area may make a temporary dent in your skin. You may not be able to move your arm or leg as much as usual. There are many causes of peripheral edema. It can be a complication of other diseases, such as congestive heart failure, kidney disease, or a problem with your blood circulation. It also can be a side effect of certain medicines. It often happens to women during pregnancy. Sometimes, the cause is not known. Treating the underlying condition is often the only treatment for peripheral edema. Follow these instructions at home: Pay attention to any changes in your symptoms. Take these actions to help with your discomfort:  Raise (elevate) your legs while you are sitting or lying down.  Move around often to prevent stiffness and to lessen swelling. Do not sit or stand for long periods of time.  Wear  support stockings as told by your health care provider.  Follow instructions from your health care provider about limiting salt (sodium) in your diet. Sometimes eating less salt can reduce swelling.  Take over-the-counter and prescription medicines only as told by your health care provider. Your health care provider may prescribe medicine to help your body get rid of excess water (diuretic).  Keep all follow-up visits as told by your health care provider. This is important.  Contact a health care provider if:  You have a fever.  Your edema starts suddenly or is getting worse, especially if you are pregnant or have a medical condition.  You have swelling in only one leg.  You have increased swelling and pain in your legs. Get help right away if:  You develop shortness of breath, especially when you are lying down.  You have pain in your chest or abdomen.  You feel weak.  You faint. This information is not intended to replace advice given to you by your health care provider. Make sure you discuss any questions you have with your health care provider. Document Released: 06/28/2004 Document Revised: 10/24/2015 Document Reviewed: 12/01/2014 Elsevier Interactive Patient Education  2018 [employer].   Keeping you healthy  Get these tests  Blood pressure- Have your blood pressure checked once a year by your healthcare provider.  Normal blood pressure is 120/80  Weight- Have your body mass index (BMI) calculated to screen for obesity.  BMI is a measure of body fat based on height and weight. You can also calculate your own BMI  at [email or web address].  Cholesterol- Have your cholesterol checked every year.  Diabetes- Have your blood sugar checked regularly if you have high blood pressure, high cholesterol, have a family history of diabetes or if you are overweight.  Screening for Colon Cancer- Colonoscopy starting at age 62.  Screening may begin sooner depending on your family  history and other health conditions. Follow up colonoscopy as directed by your Gastroenterologist.  Screening for Prostate Cancer- Both blood work (PSA) and a rectal exam help screen for Prostate Cancer.  Screening begins at age 43 with African-American men and at age 39 with Caucasian men.  Screening may begin sooner depending on your family history.  Take these medicines  Aspirin- One aspirin daily can help prevent Heart disease and Stroke.  Flu shot- Every fall.  Tetanus- Every 10 years.  Zostavax- Once after the age of 69 to prevent Shingles.  Pneumonia shot- Once after the age of 67; if you are younger than 55, ask your healthcare provider if you need a Pneumonia shot.  Take these steps  Don't smoke- If you do smoke, talk to your doctor about quitting.  For tips on how to quit, go to www.smokefree.gov or call 1-800-QUIT-NOW.  Be physically active- Exercise 5 days a week for at least 30 minutes.  If you are not already physically active start slow and gradually work up to 30 minutes of moderate physical activity.  Examples of moderate activity include walking briskly, mowing the yard, dancing, swimming, bicycling, etc.  Eat a healthy diet- Eat a variety of healthy food such as fruits, vegetables, low fat milk, low fat cheese, yogurt, lean meant, poultry, fish, beans, tofu, etc. For more information go to www.thenutritionsource.org  Drink alcohol in moderation- Limit alcohol intake to less than two drinks a day. Never drink and drive.  Dentist- Brush and floss twice daily; visit your dentist twice a year.  Depression- Your emotional health is as important as your physical health. If you're feeling down, or losing interest in things you would normally enjoy please talk to your healthcare provider.  Eye exam- Visit your eye doctor every year.  Safe sex- If you may be exposed to a sexually transmitted infection, use a condom.  Seat belts- Seat belts can save your life; always wear  one.  Smoke/Carbon Monoxide detectors- These detectors need to be installed on the appropriate level of your home.  Replace batteries at least once a year.  Skin cancer- When out in the sun, cover up and use sunscreen 15 SPF or higher.  Violence- If anyone is threatening you, please tell your healthcare provider.  Living Will/ Health care power of attorney- Speak with your healthcare provider and family.    IF you received an x-ray today, you will receive an invoice from [facility]. Please contact [facility] at [phone] with questions or concerns regarding your invoice.   IF you received labwork today, you will receive an invoice from [facility]. Please contact [facility] at [phone] with questions or concerns regarding your invoice.   Our billing staff will not be able to assist you with questions regarding bills from these companies.  You will be contacted with the lab results as soon as they are available. The fastest way to get your results is to activate your My Chart account. Instructions are located on the last page of this paperwork. If you have not heard from [place] regarding the results in 2 weeks, please contact this office.       I personally  performed the services described in this documentation, which was scribed in my presence. The recorded information has been reviewed and considered for accuracy and completeness, addended by me as needed, and agree with information above.  Signed,   Meredith Staggers, MD Primary Care at [facility].  09/26/17 9:33 AM

## 2017-09-27 LAB — PSA: PROSTATE SPECIFIC AG, SERUM: 1 ng/mL (ref 0.0–4.0)

## 2017-09-27 LAB — COMPREHENSIVE METABOLIC PANEL
A/G RATIO: 1.6 (ref 1.2–2.2)
ALK PHOS: 60 IU/L (ref 39–117)
ALT: 23 IU/L (ref 0–44)
AST: 19 IU/L (ref 0–40)
Albumin: 4.6 g/dL (ref 3.5–5.5)
BUN/Creatinine Ratio: 11 (ref 9–20)
BUN: 14 mg/dL (ref 6–24)
Bilirubin Total: 1.1 mg/dL (ref 0.0–1.2)
CO2: 21 mmol/L (ref 20–29)
Calcium: 10.2 mg/dL (ref 8.7–10.2)
Chloride: 102 mmol/L (ref 96–106)
Creatinine, Ser: 1.32 mg/dL — ABNORMAL HIGH (ref 0.76–1.27)
GFR calc Af Amer: 70 mL/min/{1.73_m2} (ref >59)
GFR calc non Af Amer: 60 mL/min/{1.73_m2} (ref >59)
Globulin, Total: 2.8 g/dL (ref 1.5–4.5)
Glucose: 95 mg/dL (ref 65–99)
POTASSIUM: 4.1 mmol/L (ref 3.5–5.2)
Sodium: 142 mmol/L (ref 134–144)
Total Protein: 7.4 g/dL (ref 6.0–8.5)

## 2017-09-27 LAB — LIPID PANEL
Chol/HDL Ratio: 4.2 ratio (ref 0.0–5.0)
Cholesterol, Total: 165 mg/dL (ref 100–199)
HDL: 39 mg/dL — AB (ref >39)
LDL Calculated: 106 mg/dL — ABNORMAL HIGH (ref 0–99)
Triglycerides: 99 mg/dL (ref 0–149)
VLDL Cholesterol Cal: 20 mg/dL (ref 5–40)

## 2017-09-27 LAB — HEMOGLOBIN A1C
Est. average glucose Bld gHb Est-mCnc: 105 mg/dL
HEMOGLOBIN A1C: 5.3 % (ref 4.8–5.6)

## 2019-03-04 ENCOUNTER — Telehealth: Payer: Self-pay | Admitting: Family Medicine

## 2019-03-04 NOTE — Telephone Encounter (Signed)
Please call El  from [facility] processing centerr / regarding medical records request  [phone]

## 2019-03-13 NOTE — Telephone Encounter (Signed)
I called [phone] and let [facility] know that we got this request on 03/04/19 and I faxed to [employer] and got a confirmation.

## 2019-04-10 ENCOUNTER — Ambulatory Visit (INDEPENDENT_AMBULATORY_CARE_PROVIDER_SITE_OTHER): Payer: BC Managed Care – PPO | Admitting: Family Medicine

## 2019-04-10 ENCOUNTER — Other Ambulatory Visit: Payer: Self-pay

## 2019-04-10 ENCOUNTER — Encounter: Payer: Self-pay | Admitting: Family Medicine

## 2019-04-10 VITALS — BP 137/87 | HR 96 | Temp 98.6°F | Wt 314.4 lb

## 2019-04-10 DIAGNOSIS — Z1329 Encounter for screening for other suspected endocrine disorder: Secondary | ICD-10-CM

## 2019-04-10 DIAGNOSIS — G4733 Obstructive sleep apnea (adult) (pediatric): Secondary | ICD-10-CM

## 2019-04-10 DIAGNOSIS — N401 Enlarged prostate with lower urinary tract symptoms: Secondary | ICD-10-CM

## 2019-04-10 DIAGNOSIS — Z23 Encounter for immunization: Secondary | ICD-10-CM

## 2019-04-10 DIAGNOSIS — I10 Essential (primary) hypertension: Secondary | ICD-10-CM

## 2019-04-10 DIAGNOSIS — Z125 Encounter for screening for malignant neoplasm of prostate: Secondary | ICD-10-CM | POA: Diagnosis not present

## 2019-04-10 DIAGNOSIS — E785 Hyperlipidemia, unspecified: Secondary | ICD-10-CM | POA: Diagnosis not present

## 2019-04-10 DIAGNOSIS — Z6841 Body Mass Index (BMI) 40.0 and over, adult: Secondary | ICD-10-CM

## 2019-04-10 DIAGNOSIS — Z9989 Dependence on other enabling machines and devices: Secondary | ICD-10-CM

## 2019-04-10 DIAGNOSIS — R3911 Hesitancy of micturition: Secondary | ICD-10-CM | POA: Diagnosis not present

## 2019-04-10 DIAGNOSIS — L80 Vitiligo: Secondary | ICD-10-CM

## 2019-04-10 DIAGNOSIS — R361 Hematospermia: Secondary | ICD-10-CM

## 2019-04-10 DIAGNOSIS — Z0000 Encounter for general adult medical examination without abnormal findings: Secondary | ICD-10-CM | POA: Diagnosis not present

## 2019-04-10 LAB — POCT URINALYSIS DIP (MANUAL ENTRY)
Bilirubin, UA: NEGATIVE
Glucose, UA: NEGATIVE mg/dL
Leukocytes, UA: NEGATIVE
Nitrite, UA: NEGATIVE
Protein Ur, POC: NEGATIVE mg/dL
Spec Grav, UA: 1.025 (ref 1.010–1.025)
Urobilinogen, UA: 0.2 E.U./dL
pH, UA: 5.5 (ref 5.0–8.0)

## 2019-04-10 LAB — POC MICROSCOPIC URINALYSIS (UMFC): Mucus: ABSENT

## 2019-04-10 MED ORDER — LOSARTAN POTASSIUM 50 MG PO TABS: 50.0000 mg | ORAL_TABLET | Freq: Every day | ORAL | 1 refills | Status: DC

## 2019-04-10 MED ORDER — SHINGRIX 50 MCG/0.5ML IM SUSR: 0.5000 mL | Freq: Once | INTRAMUSCULAR | 1 refills | Status: AC

## 2019-04-10 NOTE — Progress Notes (Signed)
Subjective:  Patient ID: Steven Simpson, male    DOB: [birth year]  Age: 58 y.o. MRN: [id number]  CC:  Chief Complaint  Patient presents with  . Annual Exam    here for an annual physical. Want to talk to DR about the side effect have been having from the medication Chlorthalidone    HPI Cassady Turano presents for  Annual exam, med review.  History of hypertension, obesity, obstructive sleep apnea, BPH.  Last visit with me in April 2019, transition of care and physical.   Hypertension: When discussed last April he had been on Tenoretic 50/25 mg but his medications ran out a year prior.  Home readings off meds in the 140s over 90s at that time.  He had experienced some pedal edema, previous discussion of compression stockings but had not been using them.  Swelling was worse as the day progressed. Restarted chlorthalidone alone based on home readings, plan for 6-week follow-up in April 2019. Home readings:home readings 140/80's at home - rare 90 diastolic, or 150 systolic.  No chest pains. No dyspnea.  Side effects with chlorthalidone: Noted discoloration around lower neck, L side of mouth and on penis about a month after taking med. Small discoloration on abdomen. Some discoloration on thigh similar time. Rash has remained after stopping med, but no spreading. No itching. No new sexual contacts, no hx of STI.  Some difficulty with erections - got better when stopped taking chlorthalidone.  Had tolerated tenoretic prior. Found article online about ED and chlorthalidone. Off chlorthalidone for few months.  Compression stockings - once per week.   BP Readings from Last 3 Encounters:  04/10/19 137/87  09/26/17 134/88  11/09/14 (!) 142/64   Lab Results  Component Value Date   CREATININE 1.32 (H) 09/26/2017   Elevated creatinine: Prior to April visit last year, creatinine 1.04, 1.18.  Increased to 1.32 last year. No nsaids.  Drinking water during the day.   Obesity: Body mass  index is 45.11 kg/m. Wt Readings from Last 3 Encounters:  04/10/19 (!) 314 lb 6.4 oz (142.6 kg)  09/26/17 (!) 314 lb 6.4 oz (142.6 kg)  11/09/14 (!) 306 lb 12.8 oz (139.2 kg)  Discussed avoiding fast food, sugar-containing beverages and portion control along with low intensity exercise most days per week at his physical in April 2019.  Option of meeting with nutritionist or bariatric specialist were also discussed as options.  A1c was normal at 5.3, mild hyperlipidemia with LDL 106. Has been eating better.  Would like to try diet next 6 weeks - declines referral to bariatrics or nutritionist at this time.   OSA on CPAP History of OSA with nonadherence to CPAP at that visit due to discomfort with CPAP and felt like he had been sleeping well.  Previous AHI 17 in September 2016, sleep specialist Dr. Frances Furbish.  Recommended he restart his CPAP.last physical, and if fit issues or concerns of equipment advised to follow-up with sleep specialist. Not using CPAP in past 5 months. Plans to call sleep specialist.   Cancer screening Colonoscopy 01/13/2013, repeat 10 years Prostate, PSA 1.0 in April 2019. Agrees to testing after R/B discussion.   Has noticed discoloration to semen - looked a little red, possible hematospermia. Single episode. No hematuria noted. No nocturia. Occasional hesitancy with urination, no dribbling.    Immunization History  Administered Date(s) Administered  . Tdap 06/05/2007, 09/26/2017  Flu vaccine: refused at this time.  Shingles vaccine: agrees to shingrix.    Depression screen  [facility] 04/10/2019 09/26/2017 11/09/2014 11/20/2013  Decreased Interest 0 0 0 0  Down, Depressed, Hopeless 0 0 0 0  PHQ - 2 Score 0 0 0 0    Hearing Screening   125Hz  250Hz  500Hz  1000Hz  2000Hz  3000Hz  4000Hz  6000Hz  8000Hz   Right ear:           Left ear:             Visual Acuity Screening   Right eye Left eye Both eyes  Without correction: 20/15 20/40 20/13   With correction:     optho: last appt  about 2 years ago.   Dental: every 6 months   Exercise: Ski machine at home 1-3 timess per week.    History Patient Active Problem List   Diagnosis Date Noted  . OSA (obstructive sleep apnea) 02/18/2013  . Unspecified essential hypertension 11/14/2012  . Hypertrophy of prostate without urinary obstruction and other lower urinary tract symptoms (LUTS) 11/14/2012  . Severe obesity (BMI >= 40) (HCC) 11/14/2012   Past Medical History:  Diagnosis Date  . Hypertension   . Pre-hypertension    Past Surgical History:  Procedure Laterality Date  . VASECTOMY  2012   Allergies  Allergen Reactions  . Chlorthalidone    Prior to Admission medications   Not on File   Social History   Socioeconomic History  . Marital status: Married    Spouse name: Britta MccreedyBarbara  . Number of children: 2  . Years of education: MA  . Highest education level: Not on file  Occupational History  . Occupation: COMPUTER ANALYST    Employer: [employer]    Employer: [employer]  Social Needs  . Financial resource strain: Not on file  . Food insecurity    Worry: Not on file    Inability: Not on file  . Transportation needs    Medical: Not on file    Non-medical: Not on file  Tobacco Use  . Smoking status: Never Smoker  . Smokeless tobacco: Never Used  Substance and Sexual Activity  . Alcohol use: No  . Drug use: No  . Sexual activity: Yes    Comment: number of sex partners in the last 12 months 1    [name]  Lifestyle  . Physical activity    Days per week: Not on file    Minutes per session: Not on file  . Stress: Not on file  Relationships  . Social Musicianconnections    Talks on phone: Not on file    Gets together: Not on file    Attends religious service: Not on file    Active member of club or organization: Not on file    Attends meetings of clubs or organizations: Not on file    Relationship status: Not on file  . Intimate partner violence    Fear of current or ex partner: Not on  file    Emotionally abused: Not on file    Physically abused: Not on file    Forced sexual activity: Not on file  Other Topics Concern  . Not on file  Social History Narrative   Patient lives at home with spouse.   Caffeine Use: 1 soda weekly    Review of Systems   Objective:   Vitals:   04/10/19 1415 04/10/19 1420  BP: (!) 146/93 137/87  Pulse: 96   Temp: 98.6 F (37 C)   TempSrc: Oral   SpO2: 97%   Weight: (!) 314 lb 6.4 oz (142.6 kg)  Physical Exam Vitals signs reviewed.  Constitutional:      General: He is not in acute distress.    Appearance: He is well-developed. He is obese. He is not ill-appearing or diaphoretic.  HENT:     Head: Normocephalic.      Comments: Hypopigmentation of right lower lip at the vermilion border, macules of hypopigmentation across anterior neck.    Right Ear: External ear normal.     Left Ear: External ear normal.  Eyes:     Conjunctiva/sclera: Conjunctivae normal.     Pupils: Pupils are equal, round, and reactive to light.  Neck:     Musculoskeletal: Normal range of motion and neck supple.     Thyroid: No thyromegaly.  Cardiovascular:     Rate and Rhythm: Normal rate and regular rhythm.     Heart sounds: Normal heart sounds.  Pulmonary:     Effort: Pulmonary effort is normal. No respiratory distress.     Breath sounds: Normal breath sounds. No wheezing.  Abdominal:     General: There is no distension.     Palpations: Abdomen is soft.     Tenderness: There is no abdominal tenderness.     Hernia: There is no hernia in the left inguinal area or right inguinal area.  Genitourinary:    Penis: No discharge or swelling.      Prostate: Enlarged (Large, difficult exam, unable to reach the entire prostate.). Not tender.       Comments: Areas of hypopigmentation along penile head, medial thighs, lower abdominal wall. Musculoskeletal: Normal range of motion.        General: No tenderness.     Right lower leg: Edema (2-3+ mid  tibia bilat. no ulceration/wounds. ) present.     Left lower leg: Edema present.  Lymphadenopathy:     Cervical: No cervical adenopathy.  Skin:    General: Skin is warm and dry.  Neurological:     Mental Status: He is alert and oriented to person, place, and time.     Deep Tendon Reflexes: Reflexes are normal and symmetric.  Psychiatric:        Behavior: Behavior normal.         Assessment & Plan:  Kareen WrighEland Lamantia. male . Annual physical exam  - -anticipatory guidance as below in AVS, screening labs above. Health maintenance items as above in HPI discussed/recommended as applicable. \ Essential hypertension - Plan: Comprehensive metabolic panel  Screening for prostate cancer  - We discussed pros and cons of prostate cancer screening, and after this discussion, he chose to have screening done. PSA obtained, and no concerning findings on DRE.   Hematospermia - Plan: PSA, POCT urinalysis dipstick, POCT Microscopic Urinalysis ([facility]), Ambulatory referral to Urology Urinary hesitancy - Plan: POCT urinalysis dipstick, POCT Microscopic Urinalysis ([facility]) Benign prostatic hyperplasia with urinary hesitancy - Plan: Ambulatory referral to Urology  -suspected hematospermia, microscopic hematuria noted - refer to urology, also for BPH symptoms. .   Hyperlipidemia, unspecified hyperlipidemia type - Plan: Lipid Panel   Need for shingles vaccine - Plan: Zoster Vaccine Adjuvanted [facility]) injection  Vitiligo - Plan: Ambulatory referral to Dermatology  - suspected vitiligo on exam, refer to derm.   Class 3 severe obesity with body mass index (BMI) of 45.0 to 49.9 in adult, unspecified obesity type, unspecified whether serious comorbidity present (HCC)  - diet/sctivity for weight loss planned. Option of medical or surgical bariatric/nutrition eval discussed as options.   OSA on CPAP  -  restart CPAP, monitor BP. Option of low dose HCTZ if persistent elevations.   Screening for  thyroid disorder - Plan: TSH   No orders of the defined types were placed in this encounter.  Patient Instructions    Keep a record of your blood pressures outside of the office and bring them to the next office visit along with the actual meter to make sure it is accurate.  Keep up the good work with diet changes. Exercise: goal of per week, spread out to most days per week.  I recommend calling sleep specialist and eye specialist for follow up.  Restarting CPAP and continued diet changes along with exercise may improve blood pressure to where you do not need medicine.  However if your home readings are remaining in the 140s over 90s, start hydrochlorothiazide prescription that was printed today.  If any new side effects on that medication, let me know right away.  Recheck in 6 weeks.  Shingles vaccine sent to pharmacy.  [email or web address]  I will check PSA, but would recommend meeting with urology to discuss possible blood in semen. I will place that referral.   Rash/skin findings with decreased pigment may be vitiligo.  I will refer you to dermatology to discuss that condition and treatment/testing further.  Thanks for coming in today.    Keeping you healthy  Get these tests  Blood pressure- Have your blood pressure checked once a year by your healthcare provider.  Normal blood pressure is 120/80  Weight- Have your body mass index (BMI) calculated to screen for obesity.  BMI is a measure of body fat based on height and weight. You can also calculate your own BMI at [email or web address].  Cholesterol- Have your cholesterol checked every year.  Diabetes- Have your blood sugar checked regularly if you have high blood pressure, high cholesterol, have a family history of diabetes or if you are overweight.  Screening for Colon Cancer- Colonoscopy starting at age 60.  Screening may begin sooner depending on your family history and other health  conditions. Follow up colonoscopy as directed by your Gastroenterologist.  Screening for Prostate Cancer- Both blood work (PSA) and a rectal exam help screen for Prostate Cancer.  Screening begins at age 26 with African-American men and at age 89 with Caucasian men.  Screening may begin sooner depending on your family history.  Take these medicines  Aspirin- One aspirin daily can help prevent Heart disease and Stroke.  Flu shot- Every fall.  Tetanus- Every 10 years.  Zostavax- Once after the age of 18 to prevent Shingles.  Pneumonia shot- Once after the age of 65; if you are younger than 32, ask your healthcare provider if you need a Pneumonia shot.  Take these steps  Don't smoke- If you do smoke, talk to your doctor about quitting.  For tips on how to quit, go to www.smokefree.gov or call 1-800-QUIT-NOW.  Be physically active- Exercise 5 days a week for at least 30 minutes.  If you are not already physically active start slow and gradually work up to 30 minutes of moderate physical activity.  Examples of moderate activity include walking briskly, mowing the yard, dancing, swimming, bicycling, etc.  Eat a healthy diet- Eat a variety of healthy food such as fruits, vegetables, low fat milk, low fat cheese, yogurt, lean meant, poultry, fish, beans, tofu, etc. For more information go to www.thenutritionsource.org  Drink alcohol in moderation- Limit alcohol intake to less than two drinks a day. Never drink and drive.  Dentist- Brush and floss twice daily; visit your dentist twice a year.  Depression- Your emotional health is as important as your physical health. If you're feeling down, or losing interest in things you would normally enjoy please talk to your healthcare provider.  Eye exam- Visit your eye doctor every year.  Safe sex- If you may be exposed to a sexually transmitted infection, use a condom.  Seat belts- Seat belts can save your life; always wear one.  Smoke/Carbon  Monoxide detectors- These detectors need to be installed on the appropriate level of your home.  Replace batteries at least once a year.  Skin cancer- When out in the sun, cover up and use sunscreen 15 SPF or higher.  Violence- If anyone is threatening you, please tell your healthcare provider.  Living Will/ Health care power of attorney- Speak with your healthcare provider and family.    Managing Your Hypertension Hypertension is commonly called high blood pressure. This is when the force of your blood pressing against the walls of your arteries is too strong. Arteries are blood vessels that carry blood from your heart throughout your body. Hypertension forces the heart to work harder to pump blood, and may cause the arteries to become narrow or stiff. Having untreated or uncontrolled hypertension can cause heart attack, stroke, kidney disease, and other problems. What are blood pressure readings? A blood pressure reading consists of a higher number over a lower number. Ideally, your blood pressure should be below 120/80. The first ("top") number is called the systolic pressure. It is a measure of the pressure in your arteries as your heart beats. The second ("bottom") number is called the diastolic pressure. It is a measure of the pressure in your arteries as the heart relaxes. What does my blood pressure reading mean? Blood pressure is classified into four stages. Based on your blood pressure reading, your health care provider may use the following stages to determine what type of treatment you need, if any. Systolic pressure and diastolic pressure are measured in a unit called mm Hg. Normal  Systolic pressure: below 120.  Diastolic pressure: below 80. Elevated  Systolic pressure: 120-129.  Diastolic pressure: below 80. Hypertension stage 1  Systolic pressure: 130-139.  Diastolic pressure: 80-89. Hypertension stage 2  Systolic pressure: 140 or above.  Diastolic pressure: 90 or  above. What health risks are associated with hypertension? Managing your hypertension is an important responsibility. Uncontrolled hypertension can lead to:  A heart attack.  A stroke.  A weakened blood vessel (aneurysm).  Heart failure.  Kidney damage.  Eye damage.  Metabolic syndrome.  Memory and concentration problems. What changes can I make to manage my hypertension? Hypertension can be managed by making lifestyle changes and possibly by taking medicines. Your health care provider will help you make a plan to bring your blood pressure within a normal range. Eating and drinking   Eat a diet that is high in fiber and potassium, and low in salt (sodium), added sugar, and fat. An example eating plan is called the DASH (Dietary Approaches to Stop Hypertension) diet. To eat this way: ? Eat plenty of fresh fruits and vegetables. Try to fill half of your plate at each meal with fruits and vegetables. ? Eat whole grains, such as whole wheat pasta, brown rice, or whole grain bread. Fill about one quarter of your plate with whole grains. ? Eat low-fat diary products. ? Avoid fatty cuts of meat, processed or cured meats, and poultry with skin. Fill  about one quarter of your plate with lean proteins such as fish, chicken without skin, beans, eggs, and tofu. ? Avoid premade and processed foods. These tend to be higher in sodium, added sugar, and fat.  Reduce your daily sodium intake. Most people with hypertension should eat less than 1,500 mg of sodium a day.  Limit alcohol intake to no more than 1 drink a day for nonpregnant women and 2 drinks a day for men. One drink equals 12 oz of beer, 5 oz of wine, or 1 oz of hard liquor. Lifestyle  Work with your health care provider to maintain a healthy body weight, or to lose weight. Ask what an ideal weight is for you.  Get at least 30 minutes of exercise that causes your heart to beat faster (aerobic exercise) most days of the week.  Activities may include walking, swimming, or biking.  Include exercise to strengthen your muscles (resistance exercise), such as weight lifting, as part of your weekly exercise routine. Try to do these types of exercises for 30 minutes at least 3 days a week.  Do not use any products that contain nicotine or tobacco, such as cigarettes and e-cigarettes. If you need help quitting, ask your health care provider.  Control any long-term (chronic) conditions you have, such as high cholesterol or diabetes. Monitoring  Monitor your blood pressure at home as told by your health care provider. Your personal target blood pressure may vary depending on your medical conditions, your age, and other factors.  Have your blood pressure checked regularly, as often as told by your health care provider. Working with your health care provider  Review all the medicines you take with your health care provider because there may be side effects or interactions.  Talk with your health care provider about your diet, exercise habits, and other lifestyle factors that may be contributing to hypertension.  Visit your health care provider regularly. Your health care provider can help you create and adjust your plan for managing hypertension. Will I need medicine to control my blood pressure? Your health care provider may prescribe medicine if lifestyle changes are not enough to get your blood pressure under control, and if:  Your systolic blood pressure is 130 or higher.  Your diastolic blood pressure is 80 or higher. Take medicines only as told by your health care provider. Follow the directions carefully. Blood pressure medicines must be taken as prescribed. The medicine does not work as well when you skip doses. Skipping doses also puts you at risk for problems. Contact a health care provider if:  You think you are having a reaction to medicines you have taken.  You have repeated (recurrent) headaches.  You feel  dizzy.  You have swelling in your ankles.  You have trouble with your vision. Get help right away if:  You develop a severe headache or confusion.  You have unusual weakness or numbness, or you feel faint.  You have severe pain in your chest or abdomen.  You vomit repeatedly.  You have trouble breathing. Summary  Hypertension is when the force of blood pumping through your arteries is too strong. If this condition is not controlled, it may put you at risk for serious complications.  Your personal target blood pressure may vary depending on your medical conditions, your age, and other factors. For most people, a normal blood pressure is less than 120/80.  Hypertension is managed by lifestyle changes, medicines, or both. Lifestyle changes include weight loss, eating a  healthy, low-sodium diet, exercising more, and limiting alcohol. This information is not intended to replace advice given to you by your health care provider. Make sure you discuss any questions you have with your health care provider. Document Released: 02/13/2012 Document Revised: 09/12/2018 Document Reviewed: 04/18/2016 Elsevier Patient Education  [employer].   If you have lab work done today you will be contacted with your lab results within the next 2 weeks.  If you have not heard from [place] then please contact us. The fastest way to get your results is to register for My Chart.   IF you received an x-ray today, you will receive an invoice from [facility]. Please contact [facility] at [phone] with questions or concerns regarding your invoice.   IF you received labwork today, you will receive an invoice from [facility]. Please contact [facility] at [phone] with questions or concerns regarding your invoice.   Our billing staff will not be able to assist you with questions regarding bills from these companies.  You will be contacted with the lab results as soon as they are available.  The fastest way to get your results is to activate your My Chart account. Instructions are located on the last page of this paperwork. If you have not heard from [place] regarding the results in 2 weeks, please contact this office.          Signed, Meredith Staggers, MD [facility]

## 2019-04-10 NOTE — Patient Instructions (Addendum)
Keep a record of your blood pressures outside of the office and bring them to the next office visit along with the actual meter to make sure it is accurate.  Keep up the good work with diet changes. Exercise: goal of per week, spread out to most days per week.  I recommend calling sleep specialist and eye specialist for follow up.  Restarting CPAP and continued diet changes along with exercise may improve blood pressure to where you do not need medicine.  However if your home readings are remaining in the 140s over 90s, start losartan prescription that was printed today.  If any new side effects on that medication, let me know right away.  Recheck in 6 weeks.  Shingles vaccine sent to pharmacy.  [email or web address]  I will check PSA, but would recommend meeting with urology to discuss possible blood in semen. I will place that referral.   Rash/skin findings with decreased pigment may be vitiligo.  I will refer you to dermatology to discuss that condition and treatment/testing further.  Thanks for coming in today.    Keeping you healthy  Get these tests  Blood pressure- Have your blood pressure checked once a year by your healthcare provider.  Normal blood pressure is 120/80  Weight- Have your body mass index (BMI) calculated to screen for obesity.  BMI is a measure of body fat based on height and weight. You can also calculate your own BMI at [email or web address].  Cholesterol- Have your cholesterol checked every year.  Diabetes- Have your blood sugar checked regularly if you have high blood pressure, high cholesterol, have a family history of diabetes or if you are overweight.  Screening for Colon Cancer- Colonoscopy starting at age 58.  Screening may begin sooner depending on your family history and other health conditions. Follow up colonoscopy as directed by your Gastroenterologist.  Screening for Prostate Cancer- Both blood work (PSA) and a  rectal exam help screen for Prostate Cancer.  Screening begins at age 28 with African-American men and at age 65 with Caucasian men.  Screening may begin sooner depending on your family history.  Take these medicines  Aspirin- One aspirin daily can help prevent Heart disease and Stroke.  Flu shot- Every fall.  Tetanus- Every 10 years.  Zostavax- Once after the age of 84 to prevent Shingles.  Pneumonia shot- Once after the age of 61; if you are younger than 50, ask your healthcare provider if you need a Pneumonia shot.  Take these steps  Don't smoke- If you do smoke, talk to your doctor about quitting.  For tips on how to quit, go to www.smokefree.gov or call 1-800-QUIT-NOW.  Be physically active- Exercise 5 days a week for at least 30 minutes.  If you are not already physically active start slow and gradually work up to 30 minutes of moderate physical activity.  Examples of moderate activity include walking briskly, mowing the yard, dancing, swimming, bicycling, etc.  Eat a healthy diet- Eat a variety of healthy food such as fruits, vegetables, low fat milk, low fat cheese, yogurt, lean meant, poultry, fish, beans, tofu, etc. For more information go to www.thenutritionsource.org  Drink alcohol in moderation- Limit alcohol intake to less than two drinks a day. Never drink and drive.  Dentist- Brush and floss twice daily; visit your dentist twice a year.  Depression- Your emotional health is as important as your physical health. If you're feeling down, or losing interest in things you would normally enjoy please talk to your  healthcare provider.  Eye exam- Visit your eye doctor every year.  Safe sex- If you may be exposed to a sexually transmitted infection, use a condom.  Seat belts- Seat belts can save your life; always wear one.  Smoke/Carbon Monoxide detectors- These detectors need to be installed on the appropriate level of your home.  Replace batteries at least once a  year.  Skin cancer- When out in the sun, cover up and use sunscreen 15 SPF or higher.  Violence- If anyone is threatening you, please tell your healthcare provider.  Living Will/ Health care power of attorney- Speak with your healthcare provider and family.    Managing Your Hypertension Hypertension is commonly called high blood pressure. This is when the force of your blood pressing against the walls of your arteries is too strong. Arteries are blood vessels that carry blood from your heart throughout your body. Hypertension forces the heart to work harder to pump blood, and may cause the arteries to become narrow or stiff. Having untreated or uncontrolled hypertension can cause heart attack, stroke, kidney disease, and other problems. What are blood pressure readings? A blood pressure reading consists of a higher number over a lower number. Ideally, your blood pressure should be below 120/80. The first ("top") number is called the systolic pressure. It is a measure of the pressure in your arteries as your heart beats. The second ("bottom") number is called the diastolic pressure. It is a measure of the pressure in your arteries as the heart relaxes. What does my blood pressure reading mean? Blood pressure is classified into four stages. Based on your blood pressure reading, your health care provider may use the following stages to determine what type of treatment you need, if any. Systolic pressure and diastolic pressure are measured in a unit called mm Hg. Normal  Systolic pressure: below 120.  Diastolic pressure: below 80. Elevated  Systolic pressure: 120-129.  Diastolic pressure: below 80. Hypertension stage 1  Systolic pressure: 130-139.  Diastolic pressure: 80-89. Hypertension stage 2  Systolic pressure: 140 or above.  Diastolic pressure: 90 or above. What health risks are associated with hypertension? Managing your hypertension is an important responsibility. Uncontrolled  hypertension can lead to:  A heart attack.  A stroke.  A weakened blood vessel (aneurysm).  Heart failure.  Kidney damage.  Eye damage.  Metabolic syndrome.  Memory and concentration problems. What changes can I make to manage my hypertension? Hypertension can be managed by making lifestyle changes and possibly by taking medicines. Your health care provider will help you make a plan to bring your blood pressure within a normal range. Eating and drinking   Eat a diet that is high in fiber and potassium, and low in salt (sodium), added sugar, and fat. An example eating plan is called the DASH (Dietary Approaches to Stop Hypertension) diet. To eat this way: ? Eat plenty of fresh fruits and vegetables. Try to fill half of your plate at each meal with fruits and vegetables. ? Eat whole grains, such as whole wheat pasta, brown rice, or whole grain bread. Fill about one quarter of your plate with whole grains. ? Eat low-fat diary products. ? Avoid fatty cuts of meat, processed or cured meats, and poultry with skin. Fill about one quarter of your plate with lean proteins such as fish, chicken without skin, beans, eggs, and tofu. ? Avoid premade and processed foods. These tend to be higher in sodium, added sugar, and fat.  Reduce your daily sodium  intake. Most people with hypertension should eat less than 1,500 mg of sodium a day.  Limit alcohol intake to no more than 1 drink a day for nonpregnant women and 2 drinks a day for men. One drink equals 12 oz of beer, 5 oz of wine, or 1 oz of hard liquor. Lifestyle  Work with your health care provider to maintain a healthy body weight, or to lose weight. Ask what an ideal weight is for you.  Get at least 30 minutes of exercise that causes your heart to beat faster (aerobic exercise) most days of the week. Activities may include walking, swimming, or biking.  Include exercise to strengthen your muscles (resistance exercise), such as weight  lifting, as part of your weekly exercise routine. Try to do these types of exercises for 30 minutes at least 3 days a week.  Do not use any products that contain nicotine or tobacco, such as cigarettes and e-cigarettes. If you need help quitting, ask your health care provider.  Control any long-term (chronic) conditions you have, such as high cholesterol or diabetes. Monitoring  Monitor your blood pressure at home as told by your health care provider. Your personal target blood pressure may vary depending on your medical conditions, your age, and other factors.  Have your blood pressure checked regularly, as often as told by your health care provider. Working with your health care provider  Review all the medicines you take with your health care provider because there may be side effects or interactions.  Talk with your health care provider about your diet, exercise habits, and other lifestyle factors that may be contributing to hypertension.  Visit your health care provider regularly. Your health care provider can help you create and adjust your plan for managing hypertension. Will I need medicine to control my blood pressure? Your health care provider may prescribe medicine if lifestyle changes are not enough to get your blood pressure under control, and if:  Your systolic blood pressure is 130 or higher.  Your diastolic blood pressure is 80 or higher. Take medicines only as told by your health care provider. Follow the directions carefully. Blood pressure medicines must be taken as prescribed. The medicine does not work as well when you skip doses. Skipping doses also puts you at risk for problems. Contact a health care provider if:  You think you are having a reaction to medicines you have taken.  You have repeated (recurrent) headaches.  You feel dizzy.  You have swelling in your ankles.  You have trouble with your vision. Get help right away if:  You develop a severe  headache or confusion.  You have unusual weakness or numbness, or you feel faint.  You have severe pain in your chest or abdomen.  You vomit repeatedly.  You have trouble breathing. Summary  Hypertension is when the force of blood pumping through your arteries is too strong. If this condition is not controlled, it may put you at risk for serious complications.  Your personal target blood pressure may vary depending on your medical conditions, your age, and other factors. For most people, a normal blood pressure is less than 120/80.  Hypertension is managed by lifestyle changes, medicines, or both. Lifestyle changes include weight loss, eating a healthy, low-sodium diet, exercising more, and limiting alcohol. This information is not intended to replace advice given to you by your health care provider. Make sure you discuss any questions you have with your health care provider. Document Released: 02/13/2012 Document  Revised: 09/12/2018 Document Reviewed: 04/18/2016 Elsevier Patient Education  [employer].   If you have lab work done today you will be contacted with your lab results within the next 2 weeks.  If you have not heard from [place] then please contact us. The fastest way to get your results is to register for My Chart.   IF you received an x-ray today, you will receive an invoice from [facility]. Please contact [facility] at [phone] with questions or concerns regarding your invoice.   IF you received labwork today, you will receive an invoice from [facility]. Please contact [facility] at [phone] with questions or concerns regarding your invoice.   Our billing staff will not be able to assist you with questions regarding bills from these companies.  You will be contacted with the lab results as soon as they are available. The fastest way to get your results is to activate your My Chart account. Instructions are located on the last page of this  paperwork. If you have not heard from [place] regarding the results in 2 weeks, please contact this office.

## 2019-04-11 LAB — COMPREHENSIVE METABOLIC PANEL
ALT: 21 IU/L (ref 0–44)
AST: 22 IU/L (ref 0–40)
Albumin/Globulin Ratio: 1.8 (ref 1.2–2.2)
Albumin: 4.8 g/dL (ref 3.8–4.9)
Alkaline Phosphatase: 65 IU/L (ref 39–117)
BUN/Creatinine Ratio: 11 (ref 9–20)
BUN: 13 mg/dL (ref 6–24)
Bilirubin Total: 0.8 mg/dL (ref 0.0–1.2)
CO2: 24 mmol/L (ref 20–29)
Calcium: 10 mg/dL (ref 8.7–10.2)
Chloride: 102 mmol/L (ref 96–106)
Creatinine, Ser: 1.15 mg/dL (ref 0.76–1.27)
GFR calc Af Amer: 81 mL/min/{1.73_m2} (ref >59)
GFR calc non Af Amer: 70 mL/min/{1.73_m2} (ref >59)
Globulin, Total: 2.7 g/dL (ref 1.5–4.5)
Glucose: 85 mg/dL (ref 65–99)
Potassium: 4 mmol/L (ref 3.5–5.2)
Sodium: 140 mmol/L (ref 134–144)
Total Protein: 7.5 g/dL (ref 6.0–8.5)

## 2019-04-11 LAB — LIPID PANEL
Chol/HDL Ratio: 4 ratio (ref 0.0–5.0)
Cholesterol, Total: 174 mg/dL (ref 100–199)
HDL: 43 mg/dL (ref >39)
LDL Chol Calc (NIH): 117 mg/dL — ABNORMAL HIGH (ref 0–99)
Triglycerides: 76 mg/dL (ref 0–149)
VLDL Cholesterol Cal: 14 mg/dL (ref 5–40)

## 2019-04-11 LAB — TSH: TSH: 3.81 u[IU]/mL (ref 0.450–4.500)

## 2019-04-11 LAB — PSA: Prostate Specific Ag, Serum: 1.5 ng/mL (ref 0.0–4.0)

## 2019-04-12 ENCOUNTER — Encounter: Payer: Self-pay | Admitting: Family Medicine

## 2019-04-16 ENCOUNTER — Telehealth: Payer: Self-pay | Admitting: Family Medicine

## 2019-04-16 NOTE — Telephone Encounter (Signed)
Copied from [place] [id number]. Topic: General - Other >> Apr 16, 2019  8:43 AM Rainey Pines A wrote: Patient would like a callback from nurse with his recent lab results.

## 2019-04-20 DIAGNOSIS — L80 Vitiligo: Secondary | ICD-10-CM | POA: Diagnosis not present

## 2019-04-21 NOTE — Telephone Encounter (Signed)
LVM to call office back if he has any questions about labs. Results was given over mychart.

## 2019-05-14 DIAGNOSIS — R361 Hematospermia: Secondary | ICD-10-CM | POA: Diagnosis not present

## 2019-05-14 DIAGNOSIS — R3121 Asymptomatic microscopic hematuria: Secondary | ICD-10-CM | POA: Diagnosis not present

## 2019-05-14 DIAGNOSIS — N400 Enlarged prostate without lower urinary tract symptoms: Secondary | ICD-10-CM | POA: Diagnosis not present

## 2019-05-22 ENCOUNTER — Encounter: Payer: Self-pay | Admitting: Family Medicine

## 2019-05-22 ENCOUNTER — Ambulatory Visit (INDEPENDENT_AMBULATORY_CARE_PROVIDER_SITE_OTHER): Payer: BC Managed Care – PPO | Admitting: Family Medicine

## 2019-05-22 ENCOUNTER — Other Ambulatory Visit: Payer: Self-pay

## 2019-05-22 VITALS — BP 130/80 | HR 95 | Temp 97.4°F | Ht 71.0 in | Wt 313.2 lb

## 2019-05-22 DIAGNOSIS — G4733 Obstructive sleep apnea (adult) (pediatric): Secondary | ICD-10-CM

## 2019-05-22 DIAGNOSIS — I10 Essential (primary) hypertension: Secondary | ICD-10-CM

## 2019-05-22 MED ORDER — LOSARTAN POTASSIUM 50 MG PO TABS: 50.0000 mg | ORAL_TABLET | Freq: Every day | ORAL | 1 refills | Status: DC

## 2019-05-22 NOTE — Patient Instructions (Addendum)
Keep up the good work with diet for now, and keep a record of your blood pressures outside of the office.  If his blood pressures remain over 140/90, we have the option of increasing losartan to 100 mg/day.  I did place a referral to sleep specialist and I believe that with restarting CPAP blood pressures will also improve.  Follow-up in 5 months for recheck of lab work including cholesterol but keep up the good work with diet for now.  Thank you for coming in today and let me know if there are questions.    How to Take Your Blood Pressure You can take your blood pressure at home with a machine. You may need to check your blood pressure at home:  To check if you have high blood pressure (hypertension).  To check your blood pressure over time.  To make sure your blood pressure medicine is working. Supplies needed: You will need a blood pressure machine, or monitor. You can buy one at a drugstore or online. When choosing one:  Choose one with an arm cuff.  Choose one that wraps around your upper arm. Only one finger should fit between your arm and the cuff.  Do not choose one that measures your blood pressure from your wrist or finger. Your doctor can suggest a monitor. How to prepare Avoid these things for 30 minutes before checking your blood pressure:  Drinking caffeine.  Drinking alcohol.  Eating.  Smoking.  Exercising. Five minutes before checking your blood pressure:  Pee.  Sit in a dining chair. Avoid sitting in a soft couch or armchair.  Be quiet. Do not talk. How to take your blood pressure Follow the instructions that came with your machine. If you have a digital blood pressure monitor, these may be the instructions: 1. Sit up straight. 2. Place your feet on the floor. Do not cross your ankles or legs. 3. Rest your left arm at the level of your heart. You may rest it on a table, desk, or chair. 4. Pull up your shirt sleeve. 5. Wrap the blood pressure cuff around  the upper part of your left arm. The cuff should be 1 inch (2.5 cm) above your elbow. It is best to wrap the cuff around bare skin. 6. Fit the cuff snugly around your arm. You should be able to place only one finger between the cuff and your arm. 7. Put the cord inside the groove of your elbow. 8. Press the power button. 9. Sit quietly while the cuff fills with air and loses air. 10. Write down the numbers on the screen. 11. Wait 2-3 minutes and then repeat steps 1-10. What do the numbers mean? Two numbers make up your blood pressure. The first number is called systolic pressure. The second is called diastolic pressure. An example of a blood pressure reading is "120 over 80" (or 120/80). If you are an adult and do not have a medical condition, use this guide to find out if your blood pressure is normal: Normal  First number: below 120.  Second number: below 80. Elevated  First number: 120-129.  Second number: below 80. Hypertension stage 1  First number: 130-139.  Second number: 80-89. Hypertension stage 2  First number: 140 or above.  Second number: 76 or above. Your blood pressure is above normal even if only the top or bottom number is above normal. Follow these instructions at home:  Check your blood pressure as often as your doctor tells you to.  Take your monitor to your next doctor's appointment. Your doctor will: ? Make sure you are using it correctly. ? Make sure it is working right.  Make sure you understand what your blood pressure numbers should be.  Tell your doctor if your medicines are causing side effects. Contact a doctor if:  Your blood pressure keeps being high. Get help right away if:  Your first blood pressure number is higher than 180.  Your second blood pressure number is higher than 120. This information is not intended to replace advice given to you by your health care provider. Make sure you discuss any questions you have with your health  care provider. Document Released: 05/03/2008 Document Revised: 05/03/2017 Document Reviewed: 10/28/2015 Elsevier Patient Education  2020 [employer]   Managing Your Hypertension Hypertension is commonly called high blood pressure. This is when the force of your blood pressing against the walls of your arteries is too strong. Arteries are blood vessels that carry blood from your heart throughout your body. Hypertension forces the heart to work harder to pump blood, and may cause the arteries to become narrow or stiff. Having untreated or uncontrolled hypertension can cause heart attack, stroke, kidney disease, and other problems. What are blood pressure readings? A blood pressure reading consists of a higher number over a lower number. Ideally, your blood pressure should be below 120/80. The first ("top") number is called the systolic pressure. It is a measure of the pressure in your arteries as your heart beats. The second ("bottom") number is called the diastolic pressure. It is a measure of the pressure in your arteries as the heart relaxes. What does my blood pressure reading mean? Blood pressure is classified into four stages. Based on your blood pressure reading, your health care provider may use the following stages to determine what type of treatment you need, if any. Systolic pressure and diastolic pressure are measured in a unit called mm Hg. Normal  Systolic pressure: below 120.  Diastolic pressure: below 80. Elevated  Systolic pressure: 120-129.  Diastolic pressure: below 80. Hypertension stage 1  Systolic pressure: 130-139.  Diastolic pressure: 80-89. Hypertension stage 2  Systolic pressure: 140 or above.  Diastolic pressure: 90 or above. What health risks are associated with hypertension? Managing your hypertension is an important responsibility. Uncontrolled hypertension can lead to:  A heart attack.  A stroke.  A weakened blood vessel (aneurysm).  Heart  failure.  Kidney damage.  Eye damage.  Metabolic syndrome.  Memory and concentration problems. What changes can I make to manage my hypertension? Hypertension can be managed by making lifestyle changes and possibly by taking medicines. Your health care provider will help you make a plan to bring your blood pressure within a normal range. Eating and drinking   Eat a diet that is high in fiber and potassium, and low in salt (sodium), added sugar, and fat. An example eating plan is called the DASH (Dietary Approaches to Stop Hypertension) diet. To eat this way: ? Eat plenty of fresh fruits and vegetables. Try to fill half of your plate at each meal with fruits and vegetables. ? Eat whole grains, such as whole wheat pasta, brown rice, or whole grain bread. Fill about one quarter of your plate with whole grains. ? Eat low-fat diary products. ? Avoid fatty cuts of meat, processed or cured meats, and poultry with skin. Fill about one quarter of your plate with lean proteins such as fish, chicken without skin, beans, eggs, and tofu. ?  Avoid premade and processed foods. These tend to be higher in sodium, added sugar, and fat.  Reduce your daily sodium intake. Most people with hypertension should eat less than 1,500 mg of sodium a day.  Limit alcohol intake to no more than 1 drink a day for nonpregnant women and 2 drinks a day for men. One drink equals 12 oz of beer, 5 oz of wine, or 1 oz of hard liquor. Lifestyle  Work with your health care provider to maintain a healthy body weight, or to lose weight. Ask what an ideal weight is for you.  Get at least 30 minutes of exercise that causes your heart to beat faster (aerobic exercise) most days of the week. Activities may include walking, swimming, or biking.  Include exercise to strengthen your muscles (resistance exercise), such as weight lifting, as part of your weekly exercise routine. Try to do these types of exercises for 30 minutes at least  3 days a week.  Do not use any products that contain nicotine or tobacco, such as cigarettes and e-cigarettes. If you need help quitting, ask your health care provider.  Control any long-term (chronic) conditions you have, such as high cholesterol or diabetes. Monitoring  Monitor your blood pressure at home as told by your health care provider. Your personal target blood pressure may vary depending on your medical conditions, your age, and other factors.  Have your blood pressure checked regularly, as often as told by your health care provider. Working with your health care provider  Review all the medicines you take with your health care provider because there may be side effects or interactions.  Talk with your health care provider about your diet, exercise habits, and other lifestyle factors that may be contributing to hypertension.  Visit your health care provider regularly. Your health care provider can help you create and adjust your plan for managing hypertension. Will I need medicine to control my blood pressure? Your health care provider may prescribe medicine if lifestyle changes are not enough to get your blood pressure under control, and if:  Your systolic blood pressure is 130 or higher.  Your diastolic blood pressure is 80 or higher. Take medicines only as told by your health care provider. Follow the directions carefully. Blood pressure medicines must be taken as prescribed. The medicine does not work as well when you skip doses. Skipping doses also puts you at risk for problems. Contact a health care provider if:  You think you are having a reaction to medicines you have taken.  You have repeated (recurrent) headaches.  You feel dizzy.  You have swelling in your ankles.  You have trouble with your vision. Get help right away if:  You develop a severe headache or confusion.  You have unusual weakness or numbness, or you feel faint.  You have severe pain in your  chest or abdomen.  You vomit repeatedly.  You have trouble breathing. Summary  Hypertension is when the force of blood pumping through your arteries is too strong. If this condition is not controlled, it may put you at risk for serious complications.  Your personal target blood pressure may vary depending on your medical conditions, your age, and other factors. For most people, a normal blood pressure is less than 120/80.  Hypertension is managed by lifestyle changes, medicines, or both. Lifestyle changes include weight loss, eating a healthy, low-sodium diet, exercising more, and limiting alcohol. This information is not intended to replace advice given to you by  your health care provider. Make sure you discuss any questions you have with your health care provider. Document Released: 02/13/2012 Document Revised: 09/12/2018 Document Reviewed: 04/18/2016 Elsevier Patient Education  [employer].  If you have lab work done today you will be contacted with your lab results within the next 2 weeks.  If you have not heard from [place] then please contact us. The fastest way to get your results is to register for My Chart.   IF you received an x-ray today, you will receive an invoice from [facility]. Please contact [facility] at [phone] with questions or concerns regarding your invoice.   IF you received labwork today, you will receive an invoice from [facility]. Please contact [facility] at [phone] with questions or concerns regarding your invoice.   Our billing staff will not be able to assist you with questions regarding bills from these companies.  You will be contacted with the lab results as soon as they are available. The fastest way to get your results is to activate your My Chart account. Instructions are located on the last page of this paperwork. If you have not heard from [place] regarding the results in 2 weeks, please contact this office.

## 2019-05-22 NOTE — Progress Notes (Signed)
Subjective:  Patient ID: Steven Simpson, male    DOB: 1960/09/22  Age: 58 y.o. MRN: [id number]  CC:  Chief Complaint  Patient presents with  . med condition    6 week f.u     HPI Steven Simpson presents for   Follow-up from November 6 physical with other concerns addressed at that time.  Was referred to urology for suspected hematospermia - has appt on 1/5. Also referred to dermatology for vitiligo.   Hypertension: History of OSA on CPAP, restarted CPAP and recommended monitoring blood pressure.  Option of low-dose losartan if persistent elevations.  Started  losartan 50 mg QD on 12/7.  Has not restarted CPAP - needs referral to Dr. Frances Furbish.   Home readings:130-140 systolic, 154 systolic in office.  Since starting losartan - max 147/80's.  BP 135/81 today. HR 76. Has improved diet. Cut out soft drinks, salt.  BP Readings from Last 3 Encounters:  05/22/19 130/80  04/10/19 137/87  09/26/17 134/88   Lab Results  Component Value Date   CREATININE 1.15 04/10/2019   Hyperlipidemia:  Low-dose statin versus significant changes in diet and exercise recommended with retesting in 3 to 6 months.   The 10-year ASCVD risk score Denman George DC Montez Hageman., et al., 2013) is: 12.3%   Values used to calculate the score:     Age: 18 years     Sex: Male     Is Non-Hispanic African American: Yes     Diabetic: No     Tobacco smoker: No     Systolic Blood Pressure: 130 mmHg     Is BP treated: Yes     HDL Cholesterol: 43 mg/dL     Total Cholesterol: 174 mg/dL  Lab Results  Component Value Date   CHOL 174 04/10/2019   HDL 43 04/10/2019   LDLCALC 117 (H) 04/10/2019   TRIG 76 04/10/2019   CHOLHDL 4.0 04/10/2019   Lab Results  Component Value Date   ALT 21 04/10/2019   AST 22 04/10/2019   ALKPHOS 65 04/10/2019   BILITOT 0.8 04/10/2019       History Patient Active Problem List   Diagnosis Date Noted  . OSA (obstructive sleep apnea) 02/18/2013  . Unspecified essential hypertension  11/14/2012  . Hypertrophy of prostate without urinary obstruction and other lower urinary tract symptoms (LUTS) 11/14/2012  . Severe obesity (BMI >= 40) (HCC) 11/14/2012   Past Medical History:  Diagnosis Date  . Hypertension   . Pre-hypertension    Past Surgical History:  Procedure Laterality Date  . VASECTOMY  2012   Allergies  Allergen Reactions  . Chlorthalidone     Not tolerated well.    Prior to Admission medications   Medication Sig Start Date End Date Taking? Authorizing Provider  losartan (COZAAR) 50 MG tablet Take 1 tablet (50 mg total) by mouth daily. 04/10/19  Yes Shade Flood, MD   Social History   Socioeconomic History  . Marital status: Married    Spouse name: Britta Mccreedy  . Number of children: 2  . Years of education: MA  . Highest education level: Not on file  Occupational History  . Occupation: COMPUTER ANALYST    Employer: [employer]    Employer: [employer]  Tobacco Use  . Smoking status: Never Smoker  . Smokeless tobacco: Never Used  Substance and Sexual Activity  . Alcohol use: No  . Drug use: No  . Sexual activity: Yes    Comment: number of sex partners in  the last 12 months 1    [name]  Other Topics Concern  . Not on file  Social History Narrative   Patient lives at home with spouse.   Caffeine Use: 1 soda weekly   Social Determinants of Health   Financial Resource Strain:   . Difficulty of Paying Living Expenses: Not on file  Food Insecurity:   . Worried About Programme researcher, broadcasting/film/video in the Last Year: Not on file  . Ran Out of Food in the Last Year: Not on file  Transportation Needs:   . Lack of Transportation (Medical): Not on file  . Lack of Transportation (Non-Medical): Not on file  Physical Activity:   . Days of Exercise per Week: Not on file  . Minutes of Exercise per Session: Not on file  Stress:   . Feeling of Stress : Not on file  Social Connections:   . Frequency of Communication with Friends and Family: Not  on file  . Frequency of Social Gatherings with Friends and Family: Not on file  . Attends Religious Services: Not on file  . Active Member of Clubs or Organizations: Not on file  . Attends Banker Meetings: Not on file  . Marital Status: Not on file  Intimate Partner Violence:   . Fear of Current or Ex-Partner: Not on file  . Emotionally Abused: Not on file  . Physically Abused: Not on file  . Sexually Abused: Not on file    Review of Systems  Constitutional: Negative for fatigue and unexpected weight change.  Eyes: Negative for visual disturbance.  Respiratory: Negative for cough, chest tightness and shortness of breath.   Cardiovascular: Negative for chest pain, palpitations and leg swelling.  Gastrointestinal: Negative for abdominal pain and blood in stool.  Neurological: Negative for dizziness, light-headedness and headaches.     Objective:   Vitals:   05/22/19 1356 05/22/19 1401  BP: 139/88 130/80  Pulse: 95   Temp: (!) 97.4 F (36.3 C)   TempSrc: Temporal   SpO2: 95%   Weight: (!) 313 lb 3.2 oz (142.1 kg)   Height:  (1.803 m)      Physical Exam Vitals reviewed.  Constitutional:      Appearance: He is well-developed.  HENT:     Head: Normocephalic and atraumatic.  Eyes:     Pupils: Pupils are equal, round, and reactive to light.  Neck:     Vascular: No carotid bruit or JVD.  Cardiovascular:     Rate and Rhythm: Normal rate and regular rhythm.     Heart sounds: Normal heart sounds. No murmur.  Pulmonary:     Effort: Pulmonary effort is normal.     Breath sounds: Normal breath sounds. No rales.  Skin:    General: Skin is warm and dry.  Neurological:     Mental Status: He is alert and oriented to person, place, and time.        Assessment & Plan:  Steven Simpson is a 58 y.o. male . OSA (obstructive sleep apnea) - Plan: Ambulatory referral to Sleep Studies  Essential hypertension  Improved readings.  Commended on dietary  changes including low-sodium and sodas.  Continue losartan same dose for now.  Recheck 5 months for repeat cholesterol at that time as borderline ASCVD risk score.  Referral placed to sleep studies as likely improvement in blood pressures with restarting CPAP.  No orders of the defined types were placed in this encounter.  Patient Instructions   Keep  up the good work with diet for now, and keep a record of your blood pressures outside of the office.  If his blood pressures remain over 140/90, we have the option of increasing losartan to 100 mg/day.  I did place a referral to sleep specialist and I believe that with restarting CPAP blood pressures will also improve.  Follow-up in 5 months for recheck of lab work including cholesterol but keep up the good work with diet for now.  Thank you for coming in today and let me know if there are questions.    How to Take Your Blood Pressure You can take your blood pressure at home with a machine. You may need to check your blood pressure at home:  To check if you have high blood pressure (hypertension).  To check your blood pressure over time.  To make sure your blood pressure medicine is working. Supplies needed: You will need a blood pressure machine, or monitor. You can buy one at a drugstore or online. When choosing one:  Choose one with an arm cuff.  Choose one that wraps around your upper arm. Only one finger should fit between your arm and the cuff.  Do not choose one that measures your blood pressure from your wrist or finger. Your doctor can suggest a monitor. How to prepare Avoid these things for 30 minutes before checking your blood pressure:  Drinking caffeine.  Drinking alcohol.  Eating.  Smoking.  Exercising. Five minutes before checking your blood pressure:  Pee.  Sit in a dining chair. Avoid sitting in a soft couch or armchair.  Be quiet. Do not talk. How to take your blood pressure Follow the instructions that  came with your machine. If you have a digital blood pressure monitor, these may be the instructions: 1. Sit up straight. 2. Place your feet on the floor. Do not cross your ankles or legs. 3. Rest your left arm at the level of your heart. You may rest it on a table, desk, or chair. 4. Pull up your shirt sleeve. 5. Wrap the blood pressure cuff around the upper part of your left arm. The cuff should be 1 inch (2.5 cm) above your elbow. It is best to wrap the cuff around bare skin. 6. Fit the cuff snugly around your arm. You should be able to place only one finger between the cuff and your arm. 7. Put the cord inside the groove of your elbow. 8. Press the power button. 9. Sit quietly while the cuff fills with air and loses air. 10. Write down the numbers on the screen. 11. Wait 2-3 minutes and then repeat steps 1-10. What do the numbers mean? Two numbers make up your blood pressure. The first number is called systolic pressure. The second is called diastolic pressure. An example of a blood pressure reading is "120 over 80" (or 120/80). If you are an adult and do not have a medical condition, use this guide to find out if your blood pressure is normal: Normal  First number: below 120.  Second number: below 80. Elevated  First number: 120-129.  Second number: below 80. Hypertension stage 1  First number: 130-139.  Second number: 80-89. Hypertension stage 2  First number: 140 or above.  Second number: 22 or above. Your blood pressure is above normal even if only the top or bottom number is above normal. Follow these instructions at home:  Check your blood pressure as often as your doctor tells you to.  Take your monitor to your next doctor's appointment. Your doctor will: ? Make sure you are using it correctly. ? Make sure it is working right.  Make sure you understand what your blood pressure numbers should be.  Tell your doctor if your medicines are causing side  effects. Contact a doctor if:  Your blood pressure keeps being high. Get help right away if:  Your first blood pressure number is higher than 180.  Your second blood pressure number is higher than 120. This information is not intended to replace advice given to you by your health care provider. Make sure you discuss any questions you have with your health care provider. Document Released: 05/03/2008 Document Revised: 05/03/2017 Document Reviewed: 10/28/2015 Elsevier Patient Education  2020 [employer]   Managing Your Hypertension Hypertension is commonly called high blood pressure. This is when the force of your blood pressing against the walls of your arteries is too strong. Arteries are blood vessels that carry blood from your heart throughout your body. Hypertension forces the heart to work harder to pump blood, and may cause the arteries to become narrow or stiff. Having untreated or uncontrolled hypertension can cause heart attack, stroke, kidney disease, and other problems. What are blood pressure readings? A blood pressure reading consists of a higher number over a lower number. Ideally, your blood pressure should be below 120/80. The first ("top") number is called the systolic pressure. It is a measure of the pressure in your arteries as your heart beats. The second ("bottom") number is called the diastolic pressure. It is a measure of the pressure in your arteries as the heart relaxes. What does my blood pressure reading mean? Blood pressure is classified into four stages. Based on your blood pressure reading, your health care provider may use the following stages to determine what type of treatment you need, if any. Systolic pressure and diastolic pressure are measured in a unit called mm Hg. Normal  Systolic pressure: below 120.  Diastolic pressure: below 80. Elevated  Systolic pressure: 120-129.  Diastolic pressure: below 80. Hypertension stage 1  Systolic pressure:  130-139.  Diastolic pressure: 80-89. Hypertension stage 2  Systolic pressure: 140 or above.  Diastolic pressure: 90 or above. What health risks are associated with hypertension? Managing your hypertension is an important responsibility. Uncontrolled hypertension can lead to:  A heart attack.  A stroke.  A weakened blood vessel (aneurysm).  Heart failure.  Kidney damage.  Eye damage.  Metabolic syndrome.  Memory and concentration problems. What changes can I make to manage my hypertension? Hypertension can be managed by making lifestyle changes and possibly by taking medicines. Your health care provider will help you make a plan to bring your blood pressure within a normal range. Eating and drinking   Eat a diet that is high in fiber and potassium, and low in salt (sodium), added sugar, and fat. An example eating plan is called the DASH (Dietary Approaches to Stop Hypertension) diet. To eat this way: ? Eat plenty of fresh fruits and vegetables. Try to fill half of your plate at each meal with fruits and vegetables. ? Eat whole grains, such as whole wheat pasta, brown rice, or whole grain bread. Fill about one quarter of your plate with whole grains. ? Eat low-fat diary products. ? Avoid fatty cuts of meat, processed or cured meats, and poultry with skin. Fill about one quarter of your plate with lean proteins such as fish, chicken without skin, beans, eggs, and tofu. ?  Avoid premade and processed foods. These tend to be higher in sodium, added sugar, and fat.  Reduce your daily sodium intake. Most people with hypertension should eat less than 1,500 mg of sodium a day.  Limit alcohol intake to no more than 1 drink a day for nonpregnant women and 2 drinks a day for men. One drink equals 12 oz of beer, 5 oz of wine, or 1 oz of hard liquor. Lifestyle  Work with your health care provider to maintain a healthy body weight, or to lose weight. Ask what an ideal weight is for  you.  Get at least 30 minutes of exercise that causes your heart to beat faster (aerobic exercise) most days of the week. Activities may include walking, swimming, or biking.  Include exercise to strengthen your muscles (resistance exercise), such as weight lifting, as part of your weekly exercise routine. Try to do these types of exercises for 30 minutes at least 3 days a week.  Do not use any products that contain nicotine or tobacco, such as cigarettes and e-cigarettes. If you need help quitting, ask your health care provider.  Control any long-term (chronic) conditions you have, such as high cholesterol or diabetes. Monitoring  Monitor your blood pressure at home as told by your health care provider. Your personal target blood pressure may vary depending on your medical conditions, your age, and other factors.  Have your blood pressure checked regularly, as often as told by your health care provider. Working with your health care provider  Review all the medicines you take with your health care provider because there may be side effects or interactions.  Talk with your health care provider about your diet, exercise habits, and other lifestyle factors that may be contributing to hypertension.  Visit your health care provider regularly. Your health care provider can help you create and adjust your plan for managing hypertension. Will I need medicine to control my blood pressure? Your health care provider may prescribe medicine if lifestyle changes are not enough to get your blood pressure under control, and if:  Your systolic blood pressure is 130 or higher.  Your diastolic blood pressure is 80 or higher. Take medicines only as told by your health care provider. Follow the directions carefully. Blood pressure medicines must be taken as prescribed. The medicine does not work as well when you skip doses. Skipping doses also puts you at risk for problems. Contact a health care provider  if:  You think you are having a reaction to medicines you have taken.  You have repeated (recurrent) headaches.  You feel dizzy.  You have swelling in your ankles.  You have trouble with your vision. Get help right away if:  You develop a severe headache or confusion.  You have unusual weakness or numbness, or you feel faint.  You have severe pain in your chest or abdomen.  You vomit repeatedly.  You have trouble breathing. Summary  Hypertension is when the force of blood pumping through your arteries is too strong. If this condition is not controlled, it may put you at risk for serious complications.  Your personal target blood pressure may vary depending on your medical conditions, your age, and other factors. For most people, a normal blood pressure is less than 120/80.  Hypertension is managed by lifestyle changes, medicines, or both. Lifestyle changes include weight loss, eating a healthy, low-sodium diet, exercising more, and limiting alcohol. This information is not intended to replace advice given to you by  your health care provider. Make sure you discuss any questions you have with your health care provider. Document Released: 02/13/2012 Document Revised: 09/12/2018 Document Reviewed: 04/18/2016 Elsevier Patient Education  [employer].  If you have lab work done today you will be contacted with your lab results within the next 2 weeks.  If you have not heard from [place] then please contact us. The fastest way to get your results is to register for My Chart.   IF you received an x-ray today, you will receive an invoice from [facility]. Please contact [facility] at [phone] with questions or concerns regarding your invoice.   IF you received labwork today, you will receive an invoice from [facility]. Please contact [facility] at [phone] with questions or concerns regarding your invoice.   Our billing staff will not be able to assist you with  questions regarding bills from these companies.  You will be contacted with the lab results as soon as they are available. The fastest way to get your results is to activate your My Chart account. Instructions are located on the last page of this paperwork. If you have not heard from [place] regarding the results in 2 weeks, please contact this office.          Signed, Meredith Staggers, MD [facility]

## 2019-06-15 ENCOUNTER — Encounter: Payer: Self-pay | Admitting: Neurology

## 2019-06-15 ENCOUNTER — Other Ambulatory Visit: Payer: Self-pay

## 2019-06-15 ENCOUNTER — Ambulatory Visit (INDEPENDENT_AMBULATORY_CARE_PROVIDER_SITE_OTHER): Payer: BC Managed Care – PPO | Admitting: Neurology

## 2019-06-15 VITALS — BP 138/91 | HR 89 | Temp 96.6°F | Ht 71.0 in | Wt 323.3 lb

## 2019-06-15 DIAGNOSIS — R635 Abnormal weight gain: Secondary | ICD-10-CM

## 2019-06-15 DIAGNOSIS — G4733 Obstructive sleep apnea (adult) (pediatric): Secondary | ICD-10-CM | POA: Diagnosis not present

## 2019-06-15 DIAGNOSIS — Z6841 Body Mass Index (BMI) 40.0 and over, adult: Secondary | ICD-10-CM | POA: Diagnosis not present

## 2019-06-15 NOTE — Progress Notes (Signed)
Subjective:    Patient ID: Edoardo Laforte is a 59 y.o. male.  HPI     Star Age, MD, PhD [facility] [address], [address] [place], [place] [address]  Dear Dr. Carlota Raspberry, I saw your patient, Aureliano Oshields, upon your kind request in my sleep clinic today for reevaluation of his obstructive sleep apnea.  I had seen him nearly 6 years ago for sleep apnea.  He had sleep study testing over 6 years ago in September 2014 and a CPAP titration study in October 2014.  He was not using his CPAP regularly.  He was lost to follow-up.  As you know, Mr. Lange is a 59 year old right-handed gentleman with an underlying hypertension and morbid obesity with a BMI of over 45, who reports ongoing difficulty with his sleep quality.  He reports that he has witnessed pauses while asleep and gasping sensations at times. I reviewed your office note from 05/22/2019.  His Epworth sleepiness score is 7 out of 24, fatigue severity score is 17 out of 63.  He does not have a very set bedtime routine, generally in bed between 1030 and midnight, most likely in bed between 11 and 12.  His rise time is around 6.  He is currently working from home.  He lives with his wife.  They have 1 cat in the household.  He does not drink caffeine on a daily basis.  His brother and his sister have sleep apnea diagnoses.  He has had weight gain in the past few years.  He has not used his CPAP in over a year.  A compliance download was not available today.  Previously:   08/31/2013: 59 year old right-handed gentleman with an underlying medical history of hypertension, prostate problems, and obesity, who presents for followup consultation of his obstructive sleep apnea. He is unaccompanied today. I first met him on 58/06/2012 at the request of his primary care physician, at which time he reported snoring, ankle edema, and waking up with a sense of claustrophobia. I suggested he return for sleep study. He had a  baseline sleep study on 02/04/2013, and was requested to return for CPAP titration study which he had on 03/10/2013. I went over his test results in detail today.    His baseline sleep study on 02/04/2013 showed a sleep efficiency of 78.2% with a latency to sleep of 25.5 minutes and wake after sleep onset of 67.5 minutes with severe sleep fragmentation noted. The arousal index was elevated at 17.9 per hour. He had an increased percentage of stage I and stage II sleep, near absence of slow-wave sleep and a decreased percentage of REM sleep with a borderline prolonged REM latency. Mild to moderate snoring was noted. His total AHI was 17.6 per hour, rising to 24.7 per hour in the supine position. Baseline oxygen saturation was 92% with a nadir of 82%. Time below 90% saturation was one hour, 2 minutes and 53 seconds. Based on those test results I advised him to return for a full night sleep study for proper CPAP titration and mask fitting.   His CPAP titration study from 03/10/2013 showed a sleep efficiency of 79.6% with a latency to sleep of 10 minutes and wake after sleep onset of 81 minutes with moderate to severe sleep fragmentation noted. He had an elevated arousal index. He had an elevated percentage of stage I and stage II sleep, near absence of slow-wave sleep and a decreased percentage of REM sleep with a mildly prolonged  REM latency. He had no significant EKG changes or periodic leg movements of sleep. He had elimination of snoring. He was titrated on CPAP from 5 cm to 15 cm. His AHI was reduced to 2 per hour on a pressure of 11 cm with CPR of 1. Baseline oxygen saturation was 93%, nadir was 87%.   I reviewed the patient's CPAP compliance data from 07/15/2013 to 08/13/2013, which is a total of 58 days, during which time the patient used CPAP only on 21 days. The average usage for all days was only 2 hours and 11 minutes. The percent used days greater than 4 hours was very low at 23 %, indicating poor  compliance. The residual AHI was 0.6 per hour, indicating an adequate treatment pressure of 11 cwp with EPR of 1. Air leak was low.   Today, I reviewed the patient's CPAP compliance data from 08/01/13 to 08/30/13, which is a total of 58 days, during which time the patient used CPAP on 24 days. The average usage for all days was 2 hours and 7 minutes. The percent used days greater than 4 hours was 17%, indicating poor compliance. The residual AHI was 0.5 per hour, indicating an appropriate treatment pressure of 11 cwp with EPR of 1.    Today, he reports that he cannot maintain sleep. He has tried OTC benadryl type medications. He has not tried melatonin. He tosses and turns. He has not tried [place]. He seems to tolerate the pressure and the mask.  His Past Medical History Is Significant For: Past Medical History:  Diagnosis Date  . Hypertension   . Pre-hypertension     His Past Surgical History Is Significant For: Past Surgical History:  Procedure Laterality Date  . VASECTOMY  2012    His Family History Is Significant For: Family History  Problem Relation Age of Onset  . Dementia Mother        early stage  . Heart disease Father   . Cancer Sister        throat  . Diabetes Brother        diabetes type 1    His Social History Is Significant For: Social History   Socioeconomic History  . Marital status: Married    Spouse name: Pamala Hurry  . Number of children: 2  . Years of education: MA  . Highest education level: Not on file  Occupational History  . Occupation: COMPUTER ANALYST    Employer: [employer]    Employer: [employer]  Tobacco Use  . Smoking status: Never Smoker  . Smokeless tobacco: Never Used  Substance and Sexual Activity  . Alcohol use: No  . Drug use: No  . Sexual activity: Yes    Comment: number of sex partners in the last 61 months 1    [name]  Other Topics Concern  . Not on file  Social History Narrative   Patient lives at home with  spouse.   Caffeine Use: 1 soda weekly   Social Determinants of Health   Financial Resource Strain:   . Difficulty of Paying Living Expenses: Not on file  Food Insecurity:   . Worried About Charity fundraiser in the Last Year: Not on file  . Ran Out of Food in the Last Year: Not on file  Transportation Needs:   . Lack of Transportation (Medical): Not on file  . Lack of Transportation (Non-Medical): Not on file  Physical Activity:   . Days of Exercise per Week: Not  on file  . Minutes of Exercise per Session: Not on file  Stress:   . Feeling of Stress : Not on file  Social Connections:   . Frequency of Communication with Friends and Family: Not on file  . Frequency of Social Gatherings with Friends and Family: Not on file  . Attends Religious Services: Not on file  . Active Member of Clubs or Organizations: Not on file  . Attends Archivist Meetings: Not on file  . Marital Status: Not on file    His Allergies Are:  Allergies  Allergen Reactions  . Chlorthalidone     Not tolerated well.   :   His Current Medications Are:  Outpatient Encounter Medications as of 06/15/2019  Medication Sig  . losartan (COZAAR) 50 MG tablet Take 1 tablet (50 mg total) by mouth daily.   No facility-administered encounter medications on file as of 06/15/2019.  :  Review of Systems:  Out of a complete 14 point review of systems, all are reviewed and negative with the exception of these symptoms as listed below: Review of Systems  Neurological:       Referred back by Merri Ray, MD. Reports he has not been on cpap for over a year. Pt sts he stopped because he felt like he did not need the cpap any longer, but feels like he needs to restart now. Sts last sleep study was in 2015. Epworth Sleepiness Scale 0= would never doze 1= slight chance of dozing 2= moderate chance of dozing 3= high chance of dozing  Sitting and reading:1 Watching TV:1 Sitting inactive in a public place (ex.  Theater or meeting):1 As a passenger in a car for an hour without a break:1 Lying down to rest in the afternoon:1 Sitting and talking to someone:0 Sitting quietly after lunch (no alcohol):2 In a car, while stopped in traffic:0 Total:7    Objective:  Neurological Exam  Physical Exam Physical Examination:   Vitals:   06/15/19 1511  BP: (!) 138/91  Pulse: 89  Temp: (!) 96.6 F (35.9 C)    General Examination: The patient is a very pleasant 59 y.o. male in no acute distress. He appears well-developed and well-nourished and well groomed.   HEENT: Normocephalic, atraumatic, pupils are equal, round and reactive to light and accommodation. Extraocular tracking is good without limitation to gaze excursion or nystagmus noted. Normal smooth pursuit is noted. Hearing is grossly intact. Face is symmetric with normal facial animation and normal facial sensation. Speech is clear with no dysarthria noted. There is no hypophonia. There is no lip, neck/head, jaw or voice tremor. Neck is supple with full range of passive and active motion. There are no carotid bruits on auscultation. Oropharynx exam reveals: mild mouth dryness, adequate dental hygiene and moderate airway crowding, due to larger tongue, thick soft palate. Mallampati is class II. Tongue protrudes centrally and palate elevates symmetrically. Tonsils are small, About 1+ in size, neck circumference is 20-1/4 inches.  Chest: Clear to auscultation without wheezing, rhonchi or crackles noted.  Heart: S1+S2+0, regular and normal without murmurs, rubs or gallops noted.   Abdomen: Soft, non-tender and non-distended with normal bowel sounds appreciated on auscultation.  Extremities: There is 1+ pitting edema in the distal lower extremities bilaterally.   Skin: Warm and dry without trophic changes noted.  Musculoskeletal: exam reveals no obvious joint deformities, tenderness or joint swelling or erythema.   Neurologically:  Mental  status: The patient is awake, alert and oriented in all 4  spheres. His memory, attention, language and knowledge are appropriate. There is no aphasia, agnosia, apraxia or anomia. Speech is clear with normal prosody and enunciation. Thought process is linear. Mood is congruent and affect is normal.  Cranial nerves are as described above under HEENT exam.  Motor exam: Normal bulk, strength and tone is noted. There is no drift, tremor or rebound. Romberg is negative. Fine motor skills are grossly intact.  Cerebellar testing shows no dysmetria or intention. There is no truncal or gait ataxia.  Sensory exam is intact to light touch in the upper and lower extremities.  Gait, station and balance are unremarkable. No veering to one side is noted. No leaning to one side is noted. Posture is age-appropriate and stance is narrow based. No problems turning are noted. He turns en bloc. Tandem walk is unremarkable.  Assessment and Plan:   In summary, Aristotle Lieb is a very pleasant 59 year old male with an underlying medical history of hypertension, prostate problems, and Morbid obesity with a BMI of over 45, who presents for reevaluation of his obstructive sleep apnea which was deemed to be in the moderate range by his baseline sleep study in September 2014.  He had a subsequent CPAP titration study in October 2014.  He had trouble with CPAP compliance, he had trouble maintaining sleep.  He has gained a little bit of weight in the interim.  He continues to have sleep disruption.  He Is advised to proceed with a home sleep test to reestablish his sleep apnea diagnosis and get a sense for the severity of his sleep apnea.  Ideally, a laboratory attended sleep study would be considered gold standard but since we are trying to re-evaluate him for his OSA diagnosis, We mutually agreed to proceed with a home sleep test.  I explained the home sleep test option to him.  He would most likely qualify for new equipment as  well. He is advised that the sleep lab will call him soon. I did talk to him about potential alternatives treatment options including surgical options and using a dental device.  He feels that he would likely want to pursue positive airway pressure treatment again.  We can certainly consider a referral to dentistry down the road as well as a surgical consultation if needed.  We talked about the importance of maintaining good sleep hygiene and the importance of weight loss. I plan to see him back after testing.  I answered all his questions today and he was in agreement.

## 2019-06-15 NOTE — Patient Instructions (Signed)
It was good to see you again. Here is what we discussed today and what we came up with as our plan for you:    Based on your symptoms and your exam I believe you are still at risk for obstructive sleep apnea and would benefit from reevaluation as it has been about 6 years and you need new supplies and updated machine. Therefore, I think we should proceed with a home sleep test to determine how severe your sleep apnea is now. If you have more than mild OSA, I want you to consider ongoing treatment with CPAP or autoPAP. Please remember, the risks and ramifications of moderate to severe obstructive sleep apnea or OSA are: Cardiovascular disease, including congestive heart failure, stroke, difficult to control hypertension, arrhythmias, and even type 2 diabetes has been linked to untreated OSA. Sleep apnea causes disruption of sleep and sleep deprivation in most cases, which, in turn, can cause recurrent headaches, problems with memory, mood, concentration, focus, and vigilance. Most people with untreated sleep apnea report excessive daytime sleepiness, which can affect their ability to drive. Please do not drive if you feel sleepy.   I will likely see you back after your sleep study to go over the test results and where to go from there. We will call you after your sleep study to advise about the results (most likely, you will hear from my nurse) and to set up an appointment at the time, as necessary.    Our sleep lab administrative assistant will call you to schedule your sleep study. If you don't hear back from her by about 2 weeks from now, please feel free to call her at [phone]. You can leave a message with your phone number and concerns, if you get the voicemail box. She will call back as soon as possible.

## 2019-06-16 DIAGNOSIS — K8020 Calculus of gallbladder without cholecystitis without obstruction: Secondary | ICD-10-CM | POA: Diagnosis not present

## 2019-06-16 DIAGNOSIS — R3129 Other microscopic hematuria: Secondary | ICD-10-CM | POA: Diagnosis not present

## 2019-06-16 DIAGNOSIS — R3121 Asymptomatic microscopic hematuria: Secondary | ICD-10-CM | POA: Diagnosis not present

## 2019-06-23 ENCOUNTER — Telehealth: Payer: Self-pay

## 2019-06-23 NOTE — Telephone Encounter (Signed)
Pt is not interested in having HST done at this time. States he rec'd consultation bill and doesn't think he can afford HST as well. Pt will call back when ready to schedule.

## 2019-07-10 DIAGNOSIS — R3121 Asymptomatic microscopic hematuria: Secondary | ICD-10-CM | POA: Diagnosis not present

## 2019-07-29 ENCOUNTER — Ambulatory Visit (INDEPENDENT_AMBULATORY_CARE_PROVIDER_SITE_OTHER): Payer: BC Managed Care – PPO | Admitting: Neurology

## 2019-07-29 DIAGNOSIS — G4733 Obstructive sleep apnea (adult) (pediatric): Secondary | ICD-10-CM

## 2019-07-29 DIAGNOSIS — R635 Abnormal weight gain: Secondary | ICD-10-CM

## 2019-07-29 DIAGNOSIS — Z6841 Body Mass Index (BMI) 40.0 and over, adult: Secondary | ICD-10-CM

## 2019-07-30 NOTE — Procedures (Signed)
Patient Information     First Name: Steven Last Name: Tennyson Simpson: [id number]  Birth Date: 07-01-1960 Age: 59 Gender: Male  Referring Provider: Shade Flood, MD BMI: 45.4 (W=324 lb, H=5' 11'')  Neck Circ.:  20 '' Epworth:  7/24   Sleep Study Information    Study Date: Jul 29, 2019 S/H/A Version: [id number] / 4.1.1528 / 47  History:    59 year old right-handed gentleman with an underlying hypertension and morbid obesity with a BMI of over 45, who presents for re-evaluation of his OSA.  Summary & Diagnosis:     OSA  Recommendations:     This home sleep test demonstrates moderate obstructive sleep apnea with a total AHI of 58/hour and O2 nadir of 85%. Treatment with positive airway pressure is recommended. The patient will be advised to proceed with an autoPAP titration/trial at home for now. Please note that untreated obstructive sleep apnea may carry additional perioperative morbidity. Patients with significant obstructive sleep apnea should receive perioperative PAP therapy and the surgeons and particularly the anesthesiologist should be informed of the diagnosis and the severity of the sleep disordered breathing. The patient should be cautioned not to drive, work at heights, or operate dangerous or heavy equipment when tired or sleepy. Review and reiteration of good sleep hygiene measures should be pursued with any patient. Other causes of the patient's symptoms, including circadian rhythm disturbances, an underlying mood disorder, medication effect and/or an underlying medical problem cannot be ruled out based on this test. Clinical correlation is recommended. The patient and his referring provider will be notified of the test results. The patient will be seen in follow up in sleep clinic at [facility].  I certify that I have reviewed the raw data recording prior to the issuance of this report in accordance with the standards of the American Academy of Sleep Medicine (AASM).  Huston Foley,  MD, PhD [facility]) Diplomat, ABPN (Neurology and Sleep)               Sleep Summary    Oxygen Saturation Statistics     Start Study Time: End Study Time: Total Recording Time:          10:21:24 PM 6:29:57 AM 8 h, 8 min  Total Sleep Time % REM of Sleep Time:  6 h, 28 min  29.3    Mean: 93 Minimum: 85 Maximum: 98  Mean of Desaturations Nadirs (%):   90  Oxygen Desaturation. %:   4-9 10-20 >20 Total  Events Number Total   109 8 93.2 6.8  0 0.0  117 100.0  Oxygen Saturation: <90 <=88 <85 <80 <70  Duration (minutes): Sleep % 6.5 1.7  2.8 0.0  0.7 0.0 0.0 0.0 0.0 0.0     Respiratory Indices      Total Events REM NREM All Night  pRDI:  192  pAHI:  173 ODI:  117  pAHIc:  17  % CSR: 0.0 31.2 29.1 24.2 2.7 29.6 26.3 15.9 2.7 30.1 27.1 18.3 2.7       Pulse Rate Statistics during Sleep (BPM)      Mean: 73 Minimum: 43 Maximum: 105    Indices are calculated using technically valid sleep time of 6 h, 23 min. pRDI/pAHI are calculated using oxi desaturations ? 3%  Body Position Statistics  Position Supine Prone Right Left Non-Supine  Sleep (min) 306.5 10.0 17.0 53.5 80.5  Sleep % 79.0 2.6 4.4 13.8 20.7  pRDI 21.8 N/A  49.4 67.1 62.9  pAHI 18.1 N/A 49.4 67.1 62.9  ODI 10.0 N/A 42.4 52.0 51.2     Snoring Statistics Snoring Level (dB) >40 >50 >60 >70 >80 >Threshold (45)  Sleep (min) 91.9 12.7 1.8 0.0 0.0 34.5  Sleep % 23.7 3.3 0.5 0.0 0.0 8.9    Mean: 41 dB Sleep Stages Chart                                         pAHI=27.1                                                     Mild              Moderate                    Severe                                                 5              15                    30

## 2019-07-30 NOTE — Addendum Note (Signed)
Addended by: Huston Foley on: 07/30/2019 05:56 PM   Modules accepted: Orders

## 2019-07-30 NOTE — Progress Notes (Signed)
Patient referred by Dr. Neva Seat for re-eval of his OSA, seen by me on 06/15/19, HST on 07/29/19.    Please call and notify the patient that the recent home sleep test showed obstructive sleep apnea in the moderate range. I recommend treatment an autoPAP machine at home, through a DME company (of his choice, or as per insurance requirement). The DME representative will educate him on how to use the machine, how to put the mask on, etc. I have placed an order in the chart. Please send referral, talk to patient, send report to referring MD. We will need a FU in sleep clinic for 10 weeks post-PAP set up, please arrange that with me or one of our NPs. Thanks,   Huston Foley, MD, PhD [facility])

## 2019-08-03 ENCOUNTER — Telehealth: Payer: Self-pay

## 2019-08-03 NOTE — Telephone Encounter (Signed)
I reached out to the pt and advised of results.   Pt was agreeable to order for auto pap being sent to [facility].  Pt requested to wait on scheduling f/u appt until he speaks with [facility] and seeing what his financial portion would be.   Pt was advised we would need to see him back 31-90 days after starting the machine and to please call back if he does pursue this treatment option.  Pt verbalized understanding.

## 2019-08-03 NOTE — Telephone Encounter (Signed)
-----   Message from Huston Foley, MD sent at 07/30/2019  5:56 PM EST ----- Patient referred by Dr. Neva Seat for re-eval of his OSA, seen by me on 06/15/19, HST on 07/29/19.    Please call and notify the patient that the recent home sleep test showed obstructive sleep apnea in the moderate range. I recommend treatment an autoPAP machine at home, through a DME company (of his choice, or as per insurance requirement). The DME representative will educate him on how to use the machine, how to put the mask on, etc. I have placed an order in the chart. Please send referral, talk to patient, send report to referring MD. We will need a FU in sleep clinic for 10 weeks post-PAP set up, please arrange that with me or one of our NPs. Thanks,   Huston Foley, MD, PhD [facility])

## 2019-09-04 DIAGNOSIS — G4733 Obstructive sleep apnea (adult) (pediatric): Secondary | ICD-10-CM | POA: Diagnosis not present

## 2019-10-04 DIAGNOSIS — G4733 Obstructive sleep apnea (adult) (pediatric): Secondary | ICD-10-CM | POA: Diagnosis not present

## 2019-10-21 ENCOUNTER — Ambulatory Visit: Payer: BC Managed Care – PPO | Admitting: Family Medicine

## 2019-11-04 DIAGNOSIS — G4733 Obstructive sleep apnea (adult) (pediatric): Secondary | ICD-10-CM | POA: Diagnosis not present

## 2019-11-19 ENCOUNTER — Ambulatory Visit: Payer: BC Managed Care – PPO | Admitting: Family Medicine

## 2019-12-04 DIAGNOSIS — G4733 Obstructive sleep apnea (adult) (pediatric): Secondary | ICD-10-CM | POA: Diagnosis not present

## 2019-12-11 ENCOUNTER — Other Ambulatory Visit: Payer: Self-pay | Admitting: Family Medicine

## 2019-12-11 DIAGNOSIS — I10 Essential (primary) hypertension: Secondary | ICD-10-CM

## 2019-12-24 ENCOUNTER — Ambulatory Visit: Payer: BC Managed Care – PPO | Admitting: Family Medicine

## 2020-01-04 DIAGNOSIS — G4733 Obstructive sleep apnea (adult) (pediatric): Secondary | ICD-10-CM | POA: Diagnosis not present

## 2020-01-07 ENCOUNTER — Ambulatory Visit: Payer: BC Managed Care – PPO | Admitting: Family Medicine

## 2020-02-04 DIAGNOSIS — G4733 Obstructive sleep apnea (adult) (pediatric): Secondary | ICD-10-CM | POA: Diagnosis not present

## 2020-03-05 DIAGNOSIS — G4733 Obstructive sleep apnea (adult) (pediatric): Secondary | ICD-10-CM | POA: Diagnosis not present

## 2020-04-05 DIAGNOSIS — G4733 Obstructive sleep apnea (adult) (pediatric): Secondary | ICD-10-CM | POA: Diagnosis not present

## 2020-05-05 DIAGNOSIS — G4733 Obstructive sleep apnea (adult) (pediatric): Secondary | ICD-10-CM | POA: Diagnosis not present

## 2022-07-26 ENCOUNTER — Ambulatory Visit (INDEPENDENT_AMBULATORY_CARE_PROVIDER_SITE_OTHER): Payer: BC Managed Care – PPO | Admitting: Internal Medicine

## 2022-07-26 ENCOUNTER — Encounter: Payer: Self-pay | Admitting: Internal Medicine

## 2022-07-26 VITALS — BP 135/70 | HR 88 | Temp 97.8°F | Ht 71.0 in | Wt 308.0 lb

## 2022-07-26 DIAGNOSIS — Z119 Encounter for screening for infectious and parasitic diseases, unspecified: Secondary | ICD-10-CM | POA: Diagnosis not present

## 2022-07-26 DIAGNOSIS — Z0000 Encounter for general adult medical examination without abnormal findings: Secondary | ICD-10-CM | POA: Diagnosis not present

## 2022-07-26 DIAGNOSIS — Z125 Encounter for screening for malignant neoplasm of prostate: Secondary | ICD-10-CM | POA: Diagnosis not present

## 2022-07-26 DIAGNOSIS — Z1211 Encounter for screening for malignant neoplasm of colon: Secondary | ICD-10-CM

## 2022-07-26 LAB — PSA: PSA: 1.54 ng/mL (ref 0.10–4.00)

## 2022-07-26 NOTE — Progress Notes (Signed)
[facility] [employer]: [phone] Provider: Loralee Pacas, MD   Chief Complaint:  Steven Simpson is a 62 y.o. assigned male at birth who presents today for his annual comprehensive physical exam.  Also he has of transfer of care but we don't need address any problems - this is purely preventive care visit.   Medical assistant also reports: Chief Complaint  Patient presents with   Annual Exam   Establish Care    Assessment/Plan:   Kemper was seen today for annual exam and establish care.  Preventative health care -     CBC; Future -     Comprehensive metabolic panel; Future  Severe obesity (BMI >= 40) (HCC) Overview: Obese most of adult life; weight in Dec 2003 = 250 pounds.  Orders: -     CBC; Future -     Comprehensive metabolic panel; Future -     Hemoglobin A1c; Future -     Lipid panel; Future -     TSH; Future  Screening examination for infectious disease -     HIV Antibody (routine testing w rflx)  Screening for malignant neoplasm of prostate -     PSA  Colon cancer screening -     Ambulatory referral to Gastroenterology    Today's Health Maintenance Counseling and Anticipatory Guidance:   Eye exams:  every 1-2 years recommended.  Having vision corrected can improve the quality of day-to-day life.  Eye specialists can detect certain eye conditions such as cataracts, glaucoma and age-related macular degeneration, which could lead to sight loss.  They can also detect certain rare cancers and diabetes, among other things.  Mr.Kollman reports his last eye exam was:  2 years ago Dental health: Discussed importance of regular tooth brushing, flossing, and dental visits q6 months.  Poor dentition can lead to serious medical problems - particularly problems with heart valves.  Mr.Weinel reports he has been keeping up with his dental visits.  He intends to do so in the future.  Sinus health: Encourage sterile saline nasal misting sinus  rinses daily for pollen, to reduce allergies and risk for sinus infections.   Sterile can based misting products are recommended due to superior misting and ease of maintaining sterility. Sleep Apnea screening:  He denies any significant problems with sleep quality or hypersomnolence or being advised that he has been told that he snores or has apnea He is not consistent wearing his Philips machine with nasal.   Cardiovascular Risk Factor Reduction:  Advised patient of need for regular exercise and diet rich and fruits and vegetables and healthy fats to reduce risk of heart attack and stroke.  Avoid first- and second-hand smoke and stimulants.   Avoid extreme exercise- exercise in moderation (150 minutes per week is a good goal) Wt Readings from Last 3 Encounters:  07/26/22 (!) 308 lb (139.7 kg)  06/15/19 (!) 323 lb 5 oz (146.7 kg)  05/22/19 (!) 313 lb 3.2 oz (142.1 kg)   Body mass index is 42.96 kg/m. /  He reports his diet consists of cut back on mornings - eating grapefruit salmon and boiled eggs and moderate dinner. He reports his exercise includes of hasn't started yet. Used to run.   Health maintenance and immunizations reviewed and he was encouraged to complete anything that is due: Immunization History  Administered Date(s) Administered   Moderna Covid-19 Vaccine Bivalent Booster 59yr & up 04/03/2021   Moderna Sars-Covid-2 Vaccination 08/19/2019, 09/16/2019, 04/11/2020   Tdap 06/05/2007, 09/26/2017  Unspecified SARS-COV-2 Vaccination 06/20/2022   Zoster Recombinat (Shingrix) 05/05/2019, 07/21/2019   Health Maintenance Due  Topic Date Due   HIV Screening  Never done    STD screening: testing offered today, but patient declined as he considers themself to be low risk based on his sexual history - married 56 years and monogamous   Substance use:  I discussed that my recommendation is total abstinence from all substances of abuse including smoke and 2nd hand smoke, alcohol, illicit  drugs,  inhalants, sugar.   Offered to assist with any use disorders or addictions.   Injury prevention: Discussed safety belts, safety helmets, smoke detectors. Penile cancer screening: Asked about genital warts or tumors/abnormalities of penis. Testicular cancer screening:  Patient was advised to palpate testicles, scrotum, penis for masses and inform me of any.   Already completed workup deemed due to vasectomy reaction. Thyroid cancer screening: patient advised to check by palpating thyroid for nodules Prostate cancer screening:  Denies family history of prostate cancer or hematospermia so too young for screening by current guidelines. Lab Results  Component Value Date   PSA 0.91 08/02/2014   PSA 0.66 11/14/2012      He prefer  continue monitoring PSA due to prostate symptom(s)  Colon cancer screening:    Denies strong family history of colon cancer or blood in stool so no screening is indicated until age 72.  One of his brothers had it.  He is due in August - for 10 year(s) f/u Skin cancer screening:  He denies worrisome, changing, or new skin lesions .  Return to care in 1 year for next preventative visit.   Loralee Pacas, MD     Subjective:   See problem-oriented charting in (overview sections of assessment & plan) for updated chart information added to chronic problems for future tracking  He has no acute complaints today.   Review of Systems  Constitutional:  Negative for chills, diaphoresis, fever, malaise/fatigue and weight loss.  HENT:  Negative for congestion, ear discharge, ear pain, hearing loss, nosebleeds, sinus pain, sore throat and tinnitus.   Eyes:  Negative for blurred vision, double vision, photophobia, pain, discharge and redness.  Respiratory:  Negative for cough, hemoptysis, sputum production, shortness of breath, wheezing and stridor.   Cardiovascular:  Positive for leg swelling. Negative for chest pain, palpitations, orthopnea, claudication and PND.   Gastrointestinal:  Negative for abdominal pain, blood in stool, constipation, diarrhea, heartburn, melena, nausea and vomiting.  Genitourinary:  Negative for dysuria, flank pain, frequency, hematuria and urgency.  Musculoskeletal:  Negative for back pain, falls, joint pain, myalgias and neck pain.  Skin:  Negative for itching and rash.  Neurological:  Negative for dizziness, tingling, tremors, sensory change, speech change, focal weakness, seizures, loss of consciousness, weakness and headaches.  Endo/Heme/Allergies:  Negative for environmental allergies and polydipsia. Does not bruise/bleed easily.  Psychiatric/Behavioral:  Negative for depression, hallucinations, memory loss, substance abuse and suicidal ideas. The patient is not nervous/anxious and does not have insomnia.      I attest that I have reviewed and confirmed the patients current medications to meet the medication reconciliation requirement  No current outpatient medications on file.   No current facility-administered medications for this visit.    The following were reviewed and entered/updated in epic:    07/26/2022    8:24 AM  Depression screen PHQ 2/9  Decreased Interest 0  Down, Depressed, Hopeless 0  PHQ - 2 Score 0  Altered sleeping 3  Tired,  decreased energy 1  Change in appetite 0  Feeling bad or failure about yourself  0  Trouble concentrating 0  Moving slowly or fidgety/restless 0  Suicidal thoughts 0  PHQ-9 Score 4  Difficult doing work/chores Not difficult at all   Past Medical History:  Diagnosis Date   Hypertension    Pre-hypertension    Sleep apnea    Patient Active Problem List   Diagnosis Date Noted   OSA (obstructive sleep apnea) 02/18/2013   Unspecified essential hypertension 11/14/2012   Hypertrophy of prostate without urinary obstruction and other lower urinary tract symptoms (LUTS) 11/14/2012   Severe obesity (BMI >= 40) ([place]) 11/14/2012   Past Surgical History:  Procedure  Laterality Date   VASECTOMY  2012   Family History  Problem Relation Age of Onset   Dementia Mother        early stage   Heart disease Father    Heart attack Father    Cancer Sister        throat   Cancer Sister    Diabetes Brother        diabetes type 1   Heart attack Brother    Diabetes Brother    Hypertension Brother    Obesity Brother    Allergies  Allergen Reactions   Chlorthalidone     Not tolerated well.    Social History   Tobacco Use   Smoking status: Never   Smokeless tobacco: Never  Vaping Use   Vaping Use: Never used  Substance Use Topics   Alcohol use: No   Drug use: No         Objective:  Physical Exam: BP 135/70 (BP Location: Right Arm, Patient Position: Sitting)   Pulse 88   Temp 97.8 F (36.6 C) (Temporal)   Ht 5' 11"$  (1.803 m)   Wt (!) 308 lb (139.7 kg)   SpO2 95%   BMI 42.96 kg/m   Body mass index is 42.96 kg/m. Wt Readings from Last 3 Encounters:  07/26/22 (!) 308 lb (139.7 kg)  06/15/19 (!) 323 lb 5 oz (146.7 kg)  05/22/19 (!) 313 lb 3.2 oz (142.1 kg)   Gen: NAD, resting comfortably HEENT: TMs normal bilaterally. OP clear. No thyromegaly noted.  CV: RRR with no murmurs appreciated Pulm: NWOB, CTAB with no crackles, wheezes, or rhonchi GI: Normal bowel sounds present. Soft, Nontender, Nondistended. MSK: no edema, cyanosis, or clubbing noted Skin: warm, dry Neuro: CN2-12 grossly intact. Strength 5/5 in upper and lower extremities. Reflexes symmetric and intact bilaterally.  Psych: Normal affect and thought content

## 2022-07-26 NOTE — Patient Instructions (Addendum)
Encourage trying resmed p10, various sizes and using Snore[employer] App to evaluate effectiveness. Also apple watch and other products can help you assess mask effectiveness and leak.

## 2022-07-27 LAB — HIV ANTIBODY (ROUTINE TESTING W REFLEX): HIV 1&2 Ab, 4th Generation: NONREACTIVE

## 2022-08-02 ENCOUNTER — Encounter: Payer: Self-pay | Admitting: Internal Medicine

## 2022-08-03 ENCOUNTER — Other Ambulatory Visit (INDEPENDENT_AMBULATORY_CARE_PROVIDER_SITE_OTHER): Payer: BC Managed Care – PPO

## 2022-08-03 ENCOUNTER — Other Ambulatory Visit: Payer: BC Managed Care – PPO

## 2022-08-03 DIAGNOSIS — Z0000 Encounter for general adult medical examination without abnormal findings: Secondary | ICD-10-CM

## 2022-08-03 LAB — COMPREHENSIVE METABOLIC PANEL
ALT: 14 U/L (ref 0–53)
AST: 13 U/L (ref 0–37)
Albumin: 4.1 g/dL (ref 3.5–5.2)
Alkaline Phosphatase: 63 U/L (ref 39–117)
BUN: 13 mg/dL (ref 6–23)
CO2: 28 mEq/L (ref 19–32)
Calcium: 10.1 mg/dL (ref 8.4–10.5)
Chloride: 103 mEq/L (ref 96–112)
Creatinine, Ser: 1.1 mg/dL (ref 0.40–1.50)
GFR: 72.83 mL/min (ref >60.00)
Glucose, Bld: 110 mg/dL — ABNORMAL HIGH (ref 70–99)
Potassium: 3.7 mEq/L (ref 3.5–5.1)
Sodium: 140 mEq/L (ref 135–145)
Total Bilirubin: 1 mg/dL (ref 0.2–1.2)
Total Protein: 6.8 g/dL (ref 6.0–8.3)

## 2022-08-03 LAB — CBC
HCT: 48.5 % (ref 39.0–52.0)
Hemoglobin: 16.3 g/dL (ref 13.0–17.0)
MCHC: 33.6 g/dL (ref 30.0–36.0)
MCV: 95.4 fl (ref 78.0–100.0)
Platelets: 226 10*3/uL (ref 150.0–400.0)
RBC: 5.08 Mil/uL (ref 4.22–5.81)
RDW: 12.9 % (ref 11.5–15.5)
WBC: 7.2 10*3/uL (ref 4.0–10.5)

## 2022-08-03 LAB — LIPID PANEL
Cholesterol: 153 mg/dL (ref 0–200)
HDL: 37.2 mg/dL — ABNORMAL LOW (ref >39.00)
LDL Cholesterol: 94 mg/dL (ref 0–99)
NonHDL: 115.64
Total CHOL/HDL Ratio: 4
Triglycerides: 108 mg/dL (ref 0.0–149.0)
VLDL: 21.6 mg/dL (ref 0.0–40.0)

## 2022-08-03 LAB — TSH: TSH: 2.82 u[IU]/mL (ref 0.35–5.50)

## 2022-08-03 LAB — HEMOGLOBIN A1C: Hgb A1c MFr Bld: 5.1 % (ref 4.6–6.5)

## 2022-08-04 ENCOUNTER — Encounter: Payer: Self-pay | Admitting: Internal Medicine

## 2022-08-05 ENCOUNTER — Encounter: Payer: Self-pay | Admitting: Internal Medicine

## 2022-08-05 DIAGNOSIS — Z9189 Other specified personal risk factors, not elsewhere classified: Secondary | ICD-10-CM | POA: Insufficient documentation

## 2022-08-05 DIAGNOSIS — E8881 Metabolic syndrome: Secondary | ICD-10-CM | POA: Insufficient documentation

## 2022-08-09 ENCOUNTER — Encounter: Payer: Self-pay | Admitting: Internal Medicine

## 2022-08-09 DIAGNOSIS — E785 Hyperlipidemia, unspecified: Secondary | ICD-10-CM | POA: Insufficient documentation

## 2022-08-09 DIAGNOSIS — E786 Lipoprotein deficiency: Secondary | ICD-10-CM | POA: Insufficient documentation

## 2022-11-13 ENCOUNTER — Ambulatory Visit: Payer: BC Managed Care – PPO | Admitting: Internal Medicine

## 2022-11-20 ENCOUNTER — Ambulatory Visit: Payer: BC Managed Care – PPO | Admitting: Internal Medicine

## 2022-11-28 ENCOUNTER — Encounter: Payer: Self-pay | Admitting: Internal Medicine

## 2022-11-28 ENCOUNTER — Ambulatory Visit (INDEPENDENT_AMBULATORY_CARE_PROVIDER_SITE_OTHER): Payer: BC Managed Care – PPO | Admitting: Internal Medicine

## 2022-11-28 VITALS — BP 149/91 | HR 82 | Temp 97.6°F | Ht 71.0 in | Wt 310.4 lb

## 2022-11-28 DIAGNOSIS — E8881 Metabolic syndrome: Secondary | ICD-10-CM

## 2022-11-28 DIAGNOSIS — Z6841 Body Mass Index (BMI) 40.0 and over, adult: Secondary | ICD-10-CM

## 2022-11-28 DIAGNOSIS — I159 Secondary hypertension, unspecified: Secondary | ICD-10-CM

## 2022-11-28 DIAGNOSIS — G4733 Obstructive sleep apnea (adult) (pediatric): Secondary | ICD-10-CM

## 2022-11-28 MED ORDER — AMLODIPINE BESYLATE 2.5 MG PO TABS
2.5000 mg | ORAL_TABLET | Freq: Every day | ORAL | 3 refills | Status: DC
Start: 2022-11-28 — End: 2023-10-15

## 2022-11-28 NOTE — Progress Notes (Signed)
[facility]: [phone]   Routine Medical Office Visit  Patient:  Steven Simpson      Age: 62 y.o.       Sex:  male  Date:   11/28/2022 Patient Care Team: Lula Olszewski, MD as PCP - General (Internal Medicine) Huston Foley, MD as Attending Physician (Neurology) Today's Healthcare Provider: Lula Olszewski, MD   Assessment and Plan:    General Health Maintenance: A colonoscopy is due and will be scheduled. Follow-up in 3-6 months is planned to assess blood pressure management and lifestyle changes.    Steven Simpson was seen today for four month follow-up.  Secondary hypertension Overview: Diastolic elevations noted since early 2009; Cardiology Nuclear Study at [facility] negative in Dec 2010. Interim history from November 28, 2022:    Home readings:none recent Patient reports taking current medications consistently and not experiencing any significant associated side effects or symptoms. Current hypertension medications:     No hypertension medications on med list      Lab Results  Component Value Date   NA 140 08/03/2022   K 3.7 08/03/2022   CREATININE 1.10 08/03/2022   GFR 72.83 08/03/2022     Assessment & Plan: Diastolic Hypertension: Diastolic blood pressure remains consistently high, increasing the risk of arterial hardening, heart attacks, and strokes due to inconsistent medication adherence. We will start Amlodipine at the lowest dose, which is particularly effective in African American patients. Lifestyle modifications will be encouraged, including reducing salt and alcohol intake, avoiding NSAIDs, and maintaining a healthy weight. Reviewed available data from patient and  BP Readings from Last 3 Encounters:  11/28/22 (!) 149/91  07/26/22 135/70  06/15/19 (!) 138/91  My individualized, goal average blood pressure for this patient, after considering the evidence for and against aggressive blood pressure goals as well as their past  medical history and preferences, is 140/90 In my medical opinion, this problem is stable, borderline controlled  Increased medication(s) prescribed after collaborative review. Following informed consent, we adjusted the medication regimen as per documented orders to now be:  Current hypertension medications:       Sig   amLODipine (NORVASC) 2.5 MG tablet (Taking) Take 1 tablet (2.5 mg total) by mouth daily.     We explained the expected benefits and potential side effects of the new medications and encouraged the patient to report any concerns. Information for patient review: Please limit and avoid: salt, alcohol, NSAIDS, excess body weight, smoking, stress, sedentary lifestyles The risks of poor control over time are FUTURE stroke and heart attacks, but if you have a blood pressure over 180 and any red flag symptoms(headache/shortness of breath/confusion/chest discomfort) you should go to the ER.  You are encouraged to do home blood pressure monitoring, at least as many times per week as blood pressure medications you are on.  For example, bring a diary with 3 home blood pressure readings per week to each visit if you are on 3 blood pressure medications.  Also recommend using an arm cuff. If you are on more than 3 medication(s) or have certain risk factors, we should do a resistant hypertension workup See AFTER VISIT SUMMARY for addition educational information provided Please let me know in advance when you need medication(s) refills, consistently taking your medication is very important!   Orders: -     amLODIPine Besylate; Take 1 tablet (2.5 mg total) by mouth daily.  Dispense: 90 tablet; Refill: 3  Severe obesity (BMI >= 40) (HCC) Overview: Obese most of  adult life; weight in Dec 2003 = 250 pounds.  Assessment & Plan: Sedentary lifestyle, plans to exercise. Has a rower will set resistance high. Also advised checking macros Denies binge eating. Doesn't think he needs medications to  control appetite. Weight Management: Acknowledging a sedentary lifestyle and the need for increased exercise, we encourage resistance training for muscle building and increased calorie burn. Dietary changes will be advised, focusing on the quality and timing of calories, with an emphasis on protein intake before workouts.   OSA (obstructive sleep apnea) Overview: November 28, 2022 interim history: not wearing CPAP every night, its uncomfortable.   Assessment & Plan: Obstructive Sleep Apnea: The inconsistent use of CPAP therapy contributes to secondary hypertension and potential energy deficits for exercise. We suggest encouraging consistent use of CPAP therapy and recommend using a hook for tubing to accommodate their movement during sleep. Flipping flopping identified problem- he needs a hook to hold tubing out of way advised patient to buy from [place].   Metabolic syndrome Overview: Lab Results  Component Value Date   HGBA1C 5.1 08/03/2022   HGBA1C 5.3 09/26/2017   HGBA1C 5.1 11/20/2013      Assessment & Plan: Truncal adiposity discussed , advised patient not to diet to lose weight but rather to get fit.             Clinical Presentation:    62 y.o. male who has Secondary hypertension; Hypertrophy of prostate without urinary obstruction and other lower urinary tract symptoms (LUTS); Severe obesity (BMI >= 40) (HCC); OSA (obstructive sleep apnea); Metabolic syndrome; At risk for heart disease; Low HDL (under 40); and Hyperlipidemia, acquired on their problem list. He presents to the office today for Four month follow-up   AI-Extracted: Discussed the use of AI scribe software for clinical note transcription with the patient, who gave verbal consent to proceed.  History of Present Illness   The patient, with a history of hypertension and sleep apnea, presents for a follow-up consultation. The primary concern is uncontrolled hypertension, with recent readings indicating stage 3  hypertension. The patient acknowledges a lack of adherence to prescribed medications and lifestyle modifications, including regular use of a CPAP machine for sleep apnea.  The patient reports no recent home blood pressure readings and is not currently on any antihypertensive medications. They express willingness to commence pharmacological management for their hypertension. The patient also acknowledges the need for lifestyle modifications, particularly in terms of diet and exercise. They report a somewhat sedentary lifestyle and infrequent exercise, currently limited to once a week on a treadmill.  The patient also reports weight gain, which they attribute to a lack of regular exercise and dietary habits. They express a desire to focus on muscle building and fat loss rather than weight loss alone. The patient denies any binge eating problems or significant fatigue that could hinder their ability to exercise.  In terms of sleep apnea management, the patient admits to inconsistent use of their CPAP machine, citing discomfort due to frequent tossing and turning during sleep. They express a willingness to improve adherence to CPAP therapy for better control of their blood pressure and overall health.  The patient also expresses concern about their prostate-specific antigen (PSA) levels due to a recent diagnosis in a friend. However, they report no family history of prostate issues. The patient's PSA trend over the past ten years shows a slow increase but remains within acceptable limits.  Lastly, the patient mentions the need to schedule a colonoscopy, as it has  been ten years since their last one. They report no recent changes in bowel habits or other gastrointestinal symptoms.     Reviewed chart data: Past Medical History:  Diagnosis Date   Hypertension    Pre-hypertension    Sleep apnea     No outpatient medications prior to visit.   No facility-administered medications prior to visit.          Clinical Data Analysis:   Physical Exam  BP (!) 149/91 (BP Location: Left Arm, Patient Position: Sitting)   Pulse 82   Temp 97.6 F (36.4 C) (Temporal)   Ht 5\' 11"  (1.803 m)   Wt (!) 310 lb 6.4 oz (140.8 kg)   SpO2 92%   BMI 43.29 kg/m  Wt Readings from Last 10 Encounters:  11/28/22 (!) 310 lb 6.4 oz (140.8 kg)  07/26/22 (!) 308 lb (139.7 kg)  06/15/19 (!) 323 lb 5 oz (146.7 kg)  05/22/19 (!) 313 lb 3.2 oz (142.1 kg)  04/10/19 (!) 314 lb 6.4 oz (142.6 kg)  09/26/17 (!) 314 lb 6.4 oz (142.6 kg)  11/09/14 (!) 306 lb 12.8 oz (139.2 kg)  08/02/14 (!) 301 lb (136.5 kg)  11/20/13 294 lb (133.4 kg)  08/31/13 (!) 305 lb (138.3 kg)   Vital signs reviewed.  Nursing notes reviewed. Weight trend reviewed. Abnormalities and Problem-Specific physical exam findings:  truncal adiposity   General Appearance:  No acute distress appreciable.   Well-groomed, healthy-appearing male.  Well proportioned with no abnormal fat distribution.  Good muscle tone. Skin: Clear and well-hydrated. Pulmonary:  Normal work of breathing at rest, no respiratory distress apparent. SpO2: 92 %  Musculoskeletal: All extremities are intact.  Neurological:  Awake, alert, oriented, and engaged.  No obvious focal neurological deficits or cognitive impairments.  Sensorium seems unclouded.   Speech is clear and coherent with logical content. Psychiatric:  Appropriate mood, pleasant and cooperative demeanor, thoughtful and engaged during the exam  Results Reviewed:    No results found for any visits on 11/28/22.  Lab on 08/03/2022  Component Date Value   TSH 08/03/2022 2.82    Cholesterol 08/03/2022 153    Triglycerides 08/03/2022 108.0    HDL 08/03/2022 37.20 (L)    VLDL 08/03/2022 21.6    LDL Cholesterol 08/03/2022 94    Total CHOL/HDL Ratio 08/03/2022 4    NonHDL 08/03/2022 115.64    Hgb A1c MFr Bld 08/03/2022 5.1    Sodium 08/03/2022 140    Potassium 08/03/2022 3.7    Chloride 08/03/2022 103    CO2  08/03/2022 28    Glucose, Bld 08/03/2022 110 (H)    BUN 08/03/2022 13    Creatinine, Ser 08/03/2022 1.10    Total Bilirubin 08/03/2022 1.0    Alkaline Phosphatase 08/03/2022 63    AST 08/03/2022 13    ALT 08/03/2022 14    Total Protein 08/03/2022 6.8    Albumin 08/03/2022 4.1    GFR 08/03/2022 72.83    Calcium 08/03/2022 10.1    WBC 08/03/2022 7.2    RBC 08/03/2022 5.08    Platelets 08/03/2022 226.0    Hemoglobin 08/03/2022 16.3    HCT 08/03/2022 48.5    MCV 08/03/2022 95.4    MCHC 08/03/2022 33.6    RDW 08/03/2022 12.9   Office Visit on 07/26/2022  Component Date Value   PSA 07/26/2022 1.54    HIV 1&2 Ab, 4th Generati* 07/26/2022 NON-REACTIVE       This encounter employed real-time, collaborative documentation. The patient actively reviewed  and updated their medical record on a shared screen, ensuring transparency and facilitating joint problem-solving for the problem list, overview, and plan. This approach promotes accurate, informed care. The treatment plan was discussed and reviewed in detail, including medication safety, potential side effects, and all patient questions. We confirmed understanding and comfort with the plan. Follow-up instructions were established, including contacting the office for any concerns, returning if symptoms worsen, persist, or new symptoms develop, and precautions for potential emergency department visits. ----------------------------------------------------- Lula Olszewski, MD  11/28/2022 4:36 PM  [facility]:  [phone]

## 2022-11-28 NOTE — Assessment & Plan Note (Addendum)
Diastolic Hypertension: Diastolic blood pressure remains consistently high, increasing the risk of arterial hardening, heart attacks, and strokes due to inconsistent medication adherence. We will start Amlodipine at the lowest dose, which is particularly effective in African American patients. Lifestyle modifications will be encouraged, including reducing salt and alcohol intake, avoiding NSAIDs, and maintaining a healthy weight. Reviewed available data from patient and  BP Readings from Last 3 Encounters:  11/28/22 (!) 149/91  07/26/22 135/70  06/15/19 (!) 138/91  My individualized, goal average blood pressure for this patient, after considering the evidence for and against aggressive blood pressure goals as well as their past medical history and preferences, is 140/90 In my medical opinion, this problem is stable, borderline controlled  Increased medication(s) prescribed after collaborative review. Following informed consent, we adjusted the medication regimen as per documented orders to now be:  Current hypertension medications:       Sig   amLODipine (NORVASC) 2.5 MG tablet (Taking) Take 1 tablet (2.5 mg total) by mouth daily.     We explained the expected benefits and potential side effects of the new medications and encouraged the patient to report any concerns. Information for patient review: Please limit and avoid: salt, alcohol, NSAIDS, excess body weight, smoking, stress, sedentary lifestyles The risks of poor control over time are FUTURE stroke and heart attacks, but if you have a blood pressure over 180 and any red flag symptoms(headache/shortness of breath/confusion/chest discomfort) you should go to the ER.  You are encouraged to do home blood pressure monitoring, at least as many times per week as blood pressure medications you are on.  For example, bring a diary with 3 home blood pressure readings per week to each visit if you are on 3 blood pressure medications.  Also recommend  using an arm cuff. If you are on more than 3 medication(s) or have certain risk factors, we should do a resistant hypertension workup See AFTER VISIT SUMMARY for addition educational information provided Please let me know in advance when you need medication(s) refills, consistently taking your medication is very important!

## 2022-11-28 NOTE — Patient Instructions (Addendum)
It was a pleasure seeing you today! Your health and satisfaction are our top priorities.  Glenetta Hew, MD  Your Providers PCP: Lula Olszewski, MD,  [phone]) Referring Provider: Lula Olszewski, MD,  [phone]) Care Team Provider: Huston Foley, MD,  [phone])  Start working out Call about colonoscopy Get CPAP hook Put reminders in phone to work out Look at [employer].   NEXT STEPS: [x]  Early Intervention: Schedule sooner appointment, call our on-call services, or go to emergency room if there is any significant Increase in pain or discomfort New or worsening symptoms Sudden or severe changes in your health [x]  Flexible Follow-Up: We recommend a Return in about 3 months (around 02/28/2023) for chronic disease monitoring and management. for optimal routine care. This allows for progress monitoring and treatment adjustments. [x]  Preventive Care: Schedule your annual preventive care visit! It's typically covered by insurance and helps identify potential health issues early. [x]  Lab & X-ray Appointments: Incomplete tests scheduled today, or call to schedule. X-rays:  [facility] (M-F, 8:30am-noon or 1pm-5pm). [x]  Medical Information Release: Sign a release form at front desk to obtain relevant medical information we don't have.  MAKING THE MOST OF OUR FOCUSED 20 MINUTE APPOINTMENTS: [x]   Clearly state your top concerns at the beginning of the visit to focus our discussion [x]   If you anticipate you will need more time, please inform the front desk during scheduling - we can book multiple appointments in the same week. [x]   If you have transportation problems- use our convenient video appointments or ask about transportation support. [x]   We can get down to business faster if you use MyChart to update information before the visit and submit non-urgent questions before your visit. Thank you for taking the time to provide details through MyChart.  Let our  nurse know and she can import this information into your encounter documents.  Arrival and Wait Times: [x]   Arriving on time ensures that everyone receives prompt attention. [x]   Early morning (8a) and afternoon (1p) appointments tend to have shortest wait times. [x]   Unfortunately, we cannot delay appointments for late arrivals or hold slots during phone calls.  Getting Answers and Following Up [x]   Simple Questions & Concerns: For quick questions or basic follow-up after your visit, reach [place] at [phone] or MyChart messaging. [x]   Complex Concerns: If your concern is more complex, scheduling an appointment might be best. Discuss this with the staff to find the most suitable option. [x]   Lab & Imaging Results: We'll contact you directly if results are abnormal or you don't use MyChart. Most normal results will be on MyChart within 2-3 business days, with a review message from Dr. Jon Billings. Haven't heard back in 2 weeks? Need results sooner? Contact us at [phone]. [x]   Referrals: Our referral coordinator will manage specialist referrals. The specialist's office should contact you within 2 weeks to schedule an appointment. Call us if you haven't heard from them after 2 weeks.  Staying Connected [x]   MyChart: Activate your MyChart for the fastest way to access results and message [place]. See the last page of this paperwork for instructions on how to activate.  Bring to Your Next Appointment [x]   Medications: Please bring all your medication bottles to your next appointment to ensure we have an accurate record of your prescriptions. [x]   Health Diaries: If you're monitoring any health conditions at home, keeping a diary of your readings can be very helpful for discussions at your next  appointment.  Billing [x]   X-ray & Lab Orders: These are billed by separate companies. Contact the invoicing company directly for questions or concerns. [x]   Visit Charges: Discuss any billing inquiries with  our administrative services team.  Your Satisfaction Matters [x]   Share Your Experience: We strive for your satisfaction! If you have any complaints, or preferably compliments, please let Dr. Jon Billings know directly or contact our Practice Administrators, Edwena Felty or [employer], by asking at the front desk.   Reviewing Your Records [x]   Review this early draft of your clinical encounter notes below and the final encounter summary tomorrow on MyChart after its been completed.  All orders placed so far are visible here: Secondary hypertension Assessment & Plan: Diastolic Hypertension: Diastolic blood pressure remains consistently high, increasing the risk of arterial hardening, heart attacks, and strokes due to inconsistent medication adherence. We will start Amlodipine at the lowest dose, which is particularly effective in African American patients. Lifestyle modifications will be encouraged, including reducing salt and alcohol intake, avoiding NSAIDs, and maintaining a healthy weight. Reviewed available data from patient and  BP Readings from Last 3 Encounters:  11/28/22 (!) 149/91  07/26/22 135/70  06/15/19 (!) 138/91  My individualized, goal average blood pressure for this patient, after considering the evidence for and against aggressive blood pressure goals as well as their past medical history and preferences, is 140/90 In my medical opinion, this problem is stable, borderline controlled  Increased medication(s) prescribed after collaborative review. Following informed consent, we adjusted the medication regimen as per documented orders to now be:  Current hypertension medications:       Sig   amLODipine (NORVASC) 2.5 MG tablet (Taking) Take 1 tablet (2.5 mg total) by mouth daily.     We explained the expected benefits and potential side effects of the new medications and encouraged the patient to report any concerns. Information for patient review: Please limit and avoid:  salt, alcohol, NSAIDS, excess body weight, smoking, stress, sedentary lifestyles The risks of poor control over time are FUTURE stroke and heart attacks, but if you have a blood pressure over 180 and any red flag symptoms(headache/shortness of breath/confusion/chest discomfort) you should go to the ER.  You are encouraged to do home blood pressure monitoring, at least as many times per week as blood pressure medications you are on.  For example, bring a diary with 3 home blood pressure readings per week to each visit if you are on 3 blood pressure medications.  Also recommend using an arm cuff. If you are on more than 3 medication(s) or have certain risk factors, we should do a resistant hypertension workup See AFTER VISIT SUMMARY for addition educational information provided Please let me know in advance when you need medication(s) refills, consistently taking your medication is very important!   Orders: -     amLODIPine Besylate; Take 1 tablet (2.5 mg total) by mouth daily.  Dispense: 90 tablet; Refill: 3  Severe obesity (BMI >= 40) (HCC) Assessment & Plan: Sedentary lifestyle, plans to exercise. Has a rower will set resistance high. Also advised checking macros Denies binge eating. Doesn't think he needs medications to control appetite. Weight Management: Acknowledging a sedentary lifestyle and the need for increased exercise, we encourage resistance training for muscle building and increased calorie burn. Dietary changes will be advised, focusing on the quality and timing of calories, with an emphasis on protein intake before workouts.   OSA (obstructive sleep apnea) Assessment & Plan: Obstructive Sleep Apnea: The  inconsistent use of CPAP therapy contributes to secondary hypertension and potential energy deficits for exercise. We suggest encouraging consistent use of CPAP therapy and recommend using a hook for tubing to accommodate their movement during sleep. Flipping flopping identified  problem- he needs a hook to hold tubing out of way advised patient to buy from [place].   Metabolic syndrome Assessment & Plan: Truncal adiposity discussed , advised patient not to diet to lose weight but rather to get fit.

## 2022-11-28 NOTE — Assessment & Plan Note (Signed)
Truncal adiposity discussed , advised patient not to diet to lose weight but rather to get fit.

## 2022-11-28 NOTE — Assessment & Plan Note (Addendum)
Obstructive Sleep Apnea: The inconsistent use of CPAP therapy contributes to secondary hypertension and potential energy deficits for exercise. We suggest encouraging consistent use of CPAP therapy and recommend using a hook for tubing to accommodate their movement during sleep. Flipping flopping identified problem- he needs a hook to hold tubing out of way advised patient to buy from [place].

## 2022-11-28 NOTE — Assessment & Plan Note (Addendum)
Sedentary lifestyle, plans to exercise. Has a rower will set resistance high. Also advised checking macros Denies binge eating. Doesn't think he needs medications to control appetite. Weight Management: Acknowledging a sedentary lifestyle and the need for increased exercise, we encourage resistance training for muscle building and increased calorie burn. Dietary changes will be advised, focusing on the quality and timing of calories, with an emphasis on protein intake before workouts.

## 2022-12-24 ENCOUNTER — Encounter: Payer: Self-pay | Admitting: Internal Medicine

## 2023-01-15 ENCOUNTER — Ambulatory Visit (AMBULATORY_SURGERY_CENTER): Payer: BC Managed Care – PPO

## 2023-01-15 VITALS — Ht 71.0 in | Wt 315.0 lb

## 2023-01-15 DIAGNOSIS — Z1211 Encounter for screening for malignant neoplasm of colon: Secondary | ICD-10-CM

## 2023-01-15 MED ORDER — NA SULFATE-K SULFATE-MG SULF 17.5-3.13-1.6 GM/177ML PO SOLN: 1.0000 | Freq: Once | ORAL | 0 refills | Status: AC

## 2023-01-15 NOTE — Progress Notes (Signed)
 No egg or soy allergy known to patient  No issues known to pt with past sedation with any surgeries or procedures Patient denies ever being told they had issues or difficulty with intubation  No FH of Malignant Hyperthermia Pt is not on diet pills Pt is not on  home 02  Pt is not on blood thinners  Pt denies issues with chronic constipation  No A fib or A flutter Have any cardiac testing pending--no Patient's chart reviewed by Cathlyn Parsons CNRA prior to previsit and patient appropriate for the [facility].  Previsit completed and red dot placed by patient's name on their procedure day (on provider's schedule).   Ambulates independently Pt instructed to use [email or web address] or GoodRx for a price reduction on prep

## 2023-01-17 ENCOUNTER — Encounter (INDEPENDENT_AMBULATORY_CARE_PROVIDER_SITE_OTHER): Payer: Self-pay

## 2023-01-28 ENCOUNTER — Encounter: Payer: Self-pay | Admitting: Internal Medicine

## 2023-02-07 ENCOUNTER — Encounter: Payer: Self-pay | Admitting: Internal Medicine

## 2023-02-07 ENCOUNTER — Ambulatory Visit (AMBULATORY_SURGERY_CENTER): Payer: BC Managed Care – PPO | Admitting: Internal Medicine

## 2023-02-07 VITALS — BP 116/84 | HR 88 | Temp 98.0°F | Resp 18 | Ht 71.0 in | Wt 315.0 lb

## 2023-02-07 DIAGNOSIS — D123 Benign neoplasm of transverse colon: Secondary | ICD-10-CM | POA: Diagnosis not present

## 2023-02-07 DIAGNOSIS — Z1211 Encounter for screening for malignant neoplasm of colon: Secondary | ICD-10-CM

## 2023-02-07 MED ORDER — SODIUM CHLORIDE 0.9 % IV SOLN
500.0000 mL | Freq: Once | INTRAVENOUS | Status: DC
Start: 2023-02-07 — End: 2023-02-07

## 2023-02-07 NOTE — Op Note (Signed)
[facility] Patient Name: Kennon Semedo Procedure Date: 02/07/2023 8:45 AM MRN: [id number] Endoscopist: Wilhemina Bonito. Marina Goodell , MD, [id number] Age: 62 Referring MD:  Date of Birth: 03-Jan-1961 Gender: Male Account #: [id number] Procedure:                Colonoscopy with hot snare x 1 Indications:              Screening for colorectal malignant neoplasm.                            Negative index exam 2014 (Dr. Elnoria Howard) Medicines:                Monitored Anesthesia Care Procedure:                Pre-Anesthesia Assessment:                           - Prior to the procedure, a History and Physical                            was performed, and patient medications and                            allergies were reviewed. The patient's tolerance of                            previous anesthesia was also reviewed. The risks                            and benefits of the procedure and the sedation                            options and risks were discussed with the patient.                            All questions were answered, and informed consent                            was obtained. Prior Anticoagulants: The patient has                            taken no anticoagulant or antiplatelet agents. ASA                            Grade Assessment: II - A patient with mild systemic                            disease. After reviewing the risks and benefits,                            the patient was deemed in satisfactory condition to                            undergo the procedure.  After obtaining informed consent, the colonoscope                            was passed under direct vision. Throughout the                            procedure, the patient's blood pressure, pulse, and                            oxygen saturations were monitored continuously. The                            Olympus CF-HQ190L [id number]) Colonoscope was                            introduced  through the anus and advanced to the the                            cecum, identified by appendiceal orifice and                            ileocecal valve. The ileocecal valve, appendiceal                            orifice, and rectum were photographed. The quality                            of the bowel preparation was excellent. The                            colonoscopy was performed without difficulty. The                            patient tolerated the procedure well. The bowel                            preparation used was SUPREP via split dose                            instruction. Scope In: 9:00:09 AM Scope Out: 9:17:13 AM Scope Withdrawal Time: 0 hours 9 minutes 32 seconds  Total Procedure Duration: 0 hours 17 minutes 4 seconds  Findings:                 An 8 mm polyp was found in the transverse colon.                            The polyp was pedunculated. The polyp was removed                            with a hot snare. Resection and retrieval were                            complete.  Internal hemorrhoids were found during retroflexion.                           The exam was otherwise without abnormality on                            direct and retroflexion views. Complications:            No immediate complications. Estimated blood loss:                            None. Estimated Blood Loss:     Estimated blood loss: none. Impression:               - One 8 mm polyp in the transverse colon, removed                            with a hot snare. Resected and retrieved.                           - Internal hemorrhoids.                           - The examination was otherwise normal on direct                            and retroflexion views. Recommendation:           - Repeat colonoscopy in 7 years for surveillance.                           - Patient has a contact number available for                            emergencies. The signs and symptoms  of potential                            delayed complications were discussed with the                            patient. Return to normal activities tomorrow.                            Written discharge instructions were provided to the                            patient.                           - Resume previous diet.                           - Continue present medications.                           - Await pathology results. Wilhemina Bonito. Marina Goodell, MD 02/07/2023 9:25:54 AM This report has been signed electronically.

## 2023-02-07 NOTE — Progress Notes (Signed)
Paced #10 oral and #7 nasal airway R nare.  Patient has severe osa

## 2023-02-07 NOTE — Progress Notes (Signed)
Pt's states no medical or surgical changes since previsit or office visit. 

## 2023-02-07 NOTE — Progress Notes (Signed)
Vss nad trans to pacu 

## 2023-02-07 NOTE — Progress Notes (Signed)
HISTORY OF PRESENT ILLNESS:  Steven Simpson is a 62 y.o. male who is sent for routine screening colonoscopy.  Negative examination in 2014 by Dr. Elnoria Howard  REVIEW OF SYSTEMS:  All non-GI ROS negative except for  Past Medical History:  Diagnosis Date   Allergy    Hypertension    Pre-hypertension    Sleep apnea     Past Surgical History:  Procedure Laterality Date   COLONOSCOPY     VASECTOMY  06/04/2010    Social History Werner Casini  reports that he has never smoked. He has never used smokeless tobacco. He reports that he does not drink alcohol and does not use drugs.  family history includes Cancer in his sister and sister; Colon cancer in his brother; Dementia in his mother; Diabetes in his brother and brother; Esophageal cancer in his sister; Heart attack in his brother and father; Heart disease in his father; Hypertension in his brother; Obesity in his brother.  Allergies  Allergen Reactions   Chlorthalidone     Not tolerated well.        PHYSICAL EXAMINATION: Vital signs: BP 118/78   Pulse 97   Temp 98 F (36.7 C)   Ht 5\' 11"  (1.803 m)   Wt (!) 315 lb (142.9 kg)   SpO2 94%   BMI 43.93 kg/m  General: Well-developed, well-nourished, no acute distress HEENT: Sclerae are anicteric, conjunctiva pink. Oral mucosa intact Lungs: Clear Heart: Regular Abdomen: soft, nontender, nondistended, no obvious ascites, no peritoneal signs, normal bowel sounds. No organomegaly. Extremities: No edema Psychiatric: alert and oriented x3. Cooperative     ASSESSMENT:  Colon cancer screening   PLAN:   Screening colonoscopy

## 2023-02-07 NOTE — Progress Notes (Signed)
Called to room to assist during endoscopic procedure.  Patient ID and intended procedure confirmed with present staff. Received instructions for my participation in the procedure from the performing physician.  

## 2023-02-07 NOTE — Patient Instructions (Signed)
Resume previous diet Continue present medications Await pathology results Handouts/information given for polyps and hemorrhoids  YOU HAD AN ENDOSCOPIC PROCEDURE TODAY AT THE [facility]:   Refer to the procedure report that was given to you for any specific questions about what was found during the examination.  If the procedure report does not answer your questions, please call your gastroenterologist to clarify.  If you requested that your care partner not be given the details of your procedure findings, then the procedure report has been included in a sealed envelope for you to review at your convenience later.  YOU SHOULD EXPECT: Some feelings of bloating in the abdomen. Passage of more gas than usual.  Walking can help get rid of the air that was put into your GI tract during the procedure and reduce the bloating. If you had a lower endoscopy (such as a colonoscopy or flexible sigmoidoscopy) you may notice spotting of blood in your stool or on the toilet paper. If you underwent a bowel prep for your procedure, you may not have a normal bowel movement for a few days.  Please Note:  You might notice some irritation and congestion in your nose or some drainage.  This is from the oxygen used during your procedure.  There is no need for concern and it should clear up in a day or so.  SYMPTOMS TO REPORT IMMEDIATELY:  Following lower endoscopy (colonoscopy):  Excessive amounts of blood in the stool  Significant tenderness or worsening of abdominal pains  Swelling of the abdomen that is new, acute  Fever of 100F or higher  For urgent or emergent issues, a gastroenterologist can be reached at any hour by calling [phone]. Do not use MyChart messaging for urgent concerns.   DIET:  We do recommend a small meal at first, but then you may proceed to your regular diet.  Drink plenty of fluids but you should avoid alcoholic beverages for 24 hours.  ACTIVITY:  You should plan to take  it easy for the rest of today and you should NOT DRIVE or use heavy machinery until tomorrow (because of the sedation medicines used during the test).    FOLLOW UP: Our staff will call the number listed on your records the next business day following your procedure.  We will call around 7:15- 8:00 am to check on you and address any questions or concerns that you may have regarding the information given to you following your procedure. If we do not reach you, we will leave a message.     If any biopsies were taken you will be contacted by phone or by letter within the next 1-3 weeks.  Please call us at [phone] if you have not heard about the biopsies in 3 weeks.    SIGNATURES/CONFIDENTIALITY: You and/or your care partner have signed paperwork which will be entered into your electronic medical record.  These signatures attest to the fact that that the information above on your After Visit Summary has been reviewed and is understood.  Full responsibility of the confidentiality of this discharge information lies with you and/or your care-partner. 

## 2023-02-08 ENCOUNTER — Telehealth: Payer: Self-pay

## 2023-02-08 NOTE — Telephone Encounter (Signed)
  Follow up Call-     02/07/2023    8:03 AM  Call back number  Post procedure Call Back phone  # [phone]  Permission to leave phone message Yes     Patient questions:  Do you have a fever, pain , or abdominal swelling? No. Pain Score  0 *  Have you tolerated food without any problems? Yes.    Have you been able to return to your normal activities? Yes.    Do you have any questions about your discharge instructions: Diet   No. Medications  No. Follow up visit  No.  Do you have questions or concerns about your Care? No.  Actions: * If pain score is 4 or above: No action needed, pain <4.

## 2023-02-12 ENCOUNTER — Encounter: Payer: Self-pay | Admitting: Internal Medicine

## 2023-03-01 ENCOUNTER — Ambulatory Visit: Payer: BC Managed Care – PPO | Admitting: Internal Medicine

## 2023-07-29 ENCOUNTER — Encounter: Payer: BC Managed Care – PPO | Admitting: Internal Medicine

## 2023-08-27 ENCOUNTER — Encounter: Payer: Self-pay | Admitting: Internal Medicine

## 2023-08-27 ENCOUNTER — Ambulatory Visit (INDEPENDENT_AMBULATORY_CARE_PROVIDER_SITE_OTHER): Payer: BC Managed Care – PPO | Admitting: Internal Medicine

## 2023-08-27 VITALS — BP 171/91 | HR 90 | Temp 97.2°F | Ht 71.0 in | Wt 334.8 lb

## 2023-08-27 DIAGNOSIS — Z13220 Encounter for screening for lipoid disorders: Secondary | ICD-10-CM | POA: Diagnosis not present

## 2023-08-27 DIAGNOSIS — H61893 Other specified disorders of external ear, bilateral: Secondary | ICD-10-CM | POA: Insufficient documentation

## 2023-08-27 DIAGNOSIS — D229 Melanocytic nevi, unspecified: Secondary | ICD-10-CM | POA: Insufficient documentation

## 2023-08-27 DIAGNOSIS — Z125 Encounter for screening for malignant neoplasm of prostate: Secondary | ICD-10-CM | POA: Diagnosis not present

## 2023-08-27 DIAGNOSIS — Z0000 Encounter for general adult medical examination without abnormal findings: Secondary | ICD-10-CM

## 2023-08-27 DIAGNOSIS — Z6841 Body Mass Index (BMI) 40.0 and over, adult: Secondary | ICD-10-CM

## 2023-08-27 DIAGNOSIS — H7402 Tympanosclerosis, left ear: Secondary | ICD-10-CM | POA: Insufficient documentation

## 2023-08-27 DIAGNOSIS — Z860100 Personal history of colon polyps, unspecified: Secondary | ICD-10-CM | POA: Insufficient documentation

## 2023-08-27 DIAGNOSIS — H6591 Unspecified nonsuppurative otitis media, right ear: Secondary | ICD-10-CM | POA: Insufficient documentation

## 2023-08-27 LAB — COMPREHENSIVE METABOLIC PANEL
ALT: 19 U/L (ref 0–53)
AST: 19 U/L (ref 0–37)
Albumin: 4.3 g/dL (ref 3.5–5.2)
Alkaline Phosphatase: 65 U/L (ref 39–117)
BUN: 13 mg/dL (ref 6–23)
CO2: 29 meq/L (ref 19–32)
Calcium: 9.6 mg/dL (ref 8.4–10.5)
Chloride: 101 meq/L (ref 96–112)
Creatinine, Ser: 1.18 mg/dL (ref 0.40–1.50)
GFR: 66.45 mL/min (ref >60.00)
Glucose, Bld: 100 mg/dL — ABNORMAL HIGH (ref 70–99)
Potassium: 4 meq/L (ref 3.5–5.1)
Sodium: 138 meq/L (ref 135–145)
Total Bilirubin: 1 mg/dL (ref 0.2–1.2)
Total Protein: 6.8 g/dL (ref 6.0–8.3)

## 2023-08-27 LAB — TSH: TSH: 7.96 u[IU]/mL — ABNORMAL HIGH (ref 0.35–5.50)

## 2023-08-27 LAB — CBC WITH DIFFERENTIAL/PLATELET
Basophils Absolute: 0 10*3/uL (ref 0.0–0.1)
Basophils Relative: 0.5 % (ref 0.0–3.0)
Eosinophils Absolute: 0.1 10*3/uL (ref 0.0–0.7)
Eosinophils Relative: 2.3 % (ref 0.0–5.0)
HCT: 49.7 % (ref 39.0–52.0)
Hemoglobin: 16.5 g/dL (ref 13.0–17.0)
Lymphocytes Relative: 29.9 % (ref 12.0–46.0)
Lymphs Abs: 1.8 10*3/uL (ref 0.7–4.0)
MCHC: 33.1 g/dL (ref 30.0–36.0)
MCV: 97.5 fl (ref 78.0–100.0)
Monocytes Absolute: 0.4 10*3/uL (ref 0.1–1.0)
Monocytes Relative: 7.2 % (ref 3.0–12.0)
Neutro Abs: 3.7 10*3/uL (ref 1.4–7.7)
Neutrophils Relative %: 60.1 % (ref 43.0–77.0)
Platelets: 179 10*3/uL (ref 150.0–400.0)
RBC: 5.1 Mil/uL (ref 4.22–5.81)
RDW: 13.2 % (ref 11.5–15.5)
WBC: 6.1 10*3/uL (ref 4.0–10.5)

## 2023-08-27 LAB — LIPID PANEL
Cholesterol: 160 mg/dL (ref 0–200)
HDL: 41.9 mg/dL (ref >39.00)
LDL Cholesterol: 98 mg/dL (ref 0–99)
NonHDL: 118.56
Total CHOL/HDL Ratio: 4
Triglycerides: 102 mg/dL (ref 0.0–149.0)
VLDL: 20.4 mg/dL (ref 0.0–40.0)

## 2023-08-27 LAB — HEMOGLOBIN A1C: Hgb A1c MFr Bld: 5.2 % (ref 4.6–6.5)

## 2023-08-27 LAB — PSA: PSA: 1.65 ng/mL (ref 0.10–4.00)

## 2023-08-27 NOTE — Assessment & Plan Note (Signed)
 Patient expressed a preference to not move forward with medication(s) management- wants preventive care only Encouraged weight loss

## 2023-08-27 NOTE — Assessment & Plan Note (Signed)
 Not due for repeat colonoscopy until 2031... was single ttubular adenoma in 2024.

## 2023-08-27 NOTE — Progress Notes (Signed)
 -- Annual Preventive Medical Office Visit --  Patient:  Steven Simpson      Age: 63 y.o.       Sex:  male  Date:   08/27/2023 Patient Care Team: Lula Olszewski, MD as PCP - General (Internal Medicine) Huston Foley, MD as Attending Physician (Neurology) Today's Healthcare Provider: Lula Olszewski, MD  ========================================= Chief complaint: Annual Exam   Purpose of Visit: Comprehensive preventive health assessment and personalized health maintenance planning.  This encounter was conducted as a Comprehensive Physical Exam (CPE) preventive care annual visit. The patient's medical history and problem list were reviewed to inform individualized preventive care recommendations.   No problem-specific medical treatment was provided during this visit.     Assessment & Plan Encounter for annual health examination Obstructive Sleep Apnea (OSA)   OSA is managed with CPAP, but anxiety-related sleep disturbances, especially before travel or important events, may affect its effectiveness. Discussed potential future use of medication that also aids in weight loss to manage OSA. He should self-monitor blood pressure and CPAP effectiveness. Consider future treatment with medication for OSA that also aids in weight loss. Use the 'Sleep as Android' app for sleep monitoring and consider AI support tools for sleep analysis. Use saline nasal spray and Breathe Right strips to improve CPAP effectiveness.  Hypertension   Blood pressure was elevated at 170s during the visit, likely influenced by anxiety and poor sleep. A previous reading at the dentist was 145/86. Emphasized the importance of home blood pressure monitoring. He should monitor blood pressure at home regularly.  Offered to medication(s) but patient wanted to limit to preventive annual exam only.  Prostate Health   Discussed PSA testing for prostate cancer screening. Previous PSA levels were slightly elevated but not  concerning. Discussed risks of false positives and unnecessary biopsies, emphasizing the benefits of early detection. Order PSA test.  Colorectal Health   An 8mm tubular adenoma was removed during colonoscopy in September 2024. No immediate concerns, but follow-up colonoscopy is scheduled for September 2031.  General Health Maintenance   Emphasized maintaining a healthy weight, regular exercise, and a balanced diet rich in fruits, vegetables, fiber, and healthy fats. Recommended vitamin D supplementation due to common deficiency and lack of insurance coverage for testing. Encouraged regular eye exams and dental cleanings. Recommend 150 minutes of exercise per week. Encourage a diet rich in fruits, vegetables, fiber, and healthy fats like fish, olive oil, and avocados. Take vitamin D supplement, 1000 IU daily. Schedule regular eye exams and dental cleanings.  Follow-up   Routine follow-up for annual health maintenance and monitoring of existing conditions. Repeat PSA test today. Follow up on colonoscopy results in September 2031. Monitor skin changes with photographs for comparison in future visits.  Recording duration: 28 minutes Severe obesity (BMI >= 40) (HCC) Patient expressed a preference to not move forward with medication(s) management- wants preventive care only Encouraged weight loss  Screening for malignant neoplasm of prostate  History of colon polyps Not due for repeat colonoscopy until 2031... was single ttubular adenoma in 2024. Melanocytic neoplasm of skin Took images to monitor over time extensive sun damaged skin  Photographs Taken 08/27/2023 :       Dry ear canal, bilateral  Fluid level behind tympanic membrane of right ear  Tympanosclerosis, left ear      Reviewed/updated/encouraged completion: Immunization History  Administered Date(s) Administered   Hep A / Hep B 04/28/2007, 05/26/2007, 10/21/2007   IPV 04/28/2007   MMR 04/28/2007  Moderna Covid-19 Fall  Seasonal Vaccine 62yrs & older 06/20/2022   Moderna Covid-19 Vaccine Bivalent Booster 4yrs & up 04/03/2021   Moderna Sars-Covid-2 Vaccination 08/19/2019, 09/16/2019, 04/11/2020   Tdap 04/28/2007, 06/05/2007, 09/26/2017   Typhoid Live 04/28/2007   Yellow Fever 04/28/2007   Zoster Recombinant(Shingrix) 05/05/2019, 07/21/2019   There are no preventive care reminders to display for this patient. Health Maintenance  Topic Date Due   COVID-19 Vaccine (6 - 2024-25 season) 09/02/2023 (Originally 02/03/2023)   DTaP/Tdap/Td (4 - Td or Tdap) 09/27/2027   Colonoscopy  02/06/2030   Hepatitis C Screening  Completed   HIV Screening  Completed   Zoster Vaccines- Shingrix  Completed   HPV VACCINES  Aged Out   INFLUENZA VACCINE  Discontinued     Today's Health Maintenance Counseling and Anticipatory Guidance:  Eye exams:  every 1-2 years Dental cleanings: every 6 months or more, brush/floss 3x daily Sinus Care: saline spray rinses daily Sleep: 8 hr nightly, good sleep hygiene, If unrestful, use e-monitoring Diet:  fruits/vegetables/fiber/healthy fats, balance and moderation Exercise:  150 minutes/weekly Discouraged any/all high risk behaviors  Today's Cancer Screening Shared Decision Making Discussions: Penile/Testicle/Scrotum: encouraged self-monitoring and reporting of genital abnormalities. Patient reports there are none. Thyroid:  checked and advised to check by palpating thyroid for nodules Prostate:  individualized risks/benefits/costs discussed ( Lab Results  Component Value Date   PSA 1.54 07/26/2022   PSA 0.91 08/02/2014   PSA 0.66 11/14/2012  ) Colon:   UpToDate on colonoscopy 02/07/23 tubular adenoma. Next due 02/06/2030 Lung:  Current guidelines recommend Individuals aged 28 to 47 who currently smoke or formerly smoked and have a >= 20 pack-year smoking history should undergo annual screening with low-dose computed tomography (LDCT). Skin: Advised regular sunscreen use. He denies  worrisome, changing, or new skin lesions. Offered to include images in chart for surveillance. Showed him pictures of melanomas for reference to educate for self-monitoring. Other: discussed lack of screening guidelines and insurance coverage for other types.     Subjective  HPI Discussed the use of AI scribe software for clinical note transcription with the patient, who gave verbal consent to proceed.  History of Present Illness Steven Simpson "Nate" is a 63 year old male who presents for an annual physical exam.  He experiences anxiety, particularly before traveling or having appointments, which affects his sleep. He uses a CPAP machine for sleep apnea, which he finds effective, but notes that anxiety can still disrupt his sleep.  His blood pressure was 145/86 at a dental appointment two weeks ago, and it was noted to be high today as well. He is currently taking amlodipine for blood pressure management and rarely checks his blood pressure at home.  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  He has a history of a tubular adenoma found during a colonoscopy on February 07, 2023, which was removed. He was advised to have a follow-up colonoscopy in seven years.  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 He does not smoke and reports no significant financial distress. He has a family history of heart disease and diabetes. No current symptoms during the review of systems and reports no significant changes in his health.   Review of Systems  Constitutional:  Negative for chills, diaphoresis, fever, malaise/fatigue and weight loss.  HENT:  Negative for congestion, ear discharge, ear pain, hearing loss, nosebleeds, sinus pain, sore  throat and tinnitus.   Eyes:  Negative for blurred vision, double vision, photophobia, pain, discharge and redness.  Respiratory:  Negative for cough, hemoptysis,  sputum production, shortness of breath, wheezing and stridor.   Cardiovascular:  Negative for chest pain, palpitations, orthopnea, claudication, leg swelling and PND.  Gastrointestinal:  Negative for abdominal pain, blood in stool, constipation, diarrhea, heartburn, melena, nausea and vomiting.  Genitourinary:  Negative for dysuria, flank pain, frequency, hematuria and urgency.  Musculoskeletal:  Negative for back pain, falls, joint pain, myalgias and neck pain.  Skin:  Negative for itching and rash.  Neurological:  Negative for dizziness, tingling, tremors, sensory change, speech change, focal weakness, seizures, loss of consciousness, weakness and headaches.  Endo/Heme/Allergies:  Negative for environmental allergies. Does not bruise/bleed easily.  Psychiatric/Behavioral:  Negative for depression, hallucinations, memory loss, substance abuse and suicidal ideas. The patient is not nervous/anxious and does not have insomnia.     Completed medication reconciliation: Current Outpatient Medications on File Prior to Visit  Medication Sig   amLODipine (NORVASC) 2.5 MG tablet Take 1 tablet (2.5 mg total) by mouth daily.   No current facility-administered medications on file prior to visit.  There are no discontinued medications.The following were reviewed and/or entered/updated into our electronic MEDICAL RECORD NUMBERPast Medical History:  Diagnosis Date   Allergy    Hypertension    Pre-hypertension    Sleep apnea    Past Surgical History:  Procedure Laterality Date   COLONOSCOPY     VASECTOMY  06/04/2010   Social History   Socioeconomic History   Marital status: Married    Spouse name: Britta Mccreedy   Number of children: 2   Years of education: MA   Highest education level: Master's degree (e.g., MA, MS, MEng, MEd, MSW, MBA)  Occupational History   Occupation: COMPUTER ANALYST    Employer: [employer]    Employer: [employer]  Tobacco Use   Smoking status: Never   Smokeless  tobacco: Never  Vaping Use   Vaping status: Never Used  Substance and Sexual Activity   Alcohol use: No   Drug use: No   Sexual activity: Yes    Comment: number of sex partners in the last 12 months 1    [name]  Other Topics Concern   Not on file  Social History Narrative   Patient lives at home with spouse.   Caffeine Use: 1 soda weekly   Social Drivers of Health   Financial Resource Strain: Patient Declined (08/26/2023)   Overall Financial Resource Strain (CARDIA)    Difficulty of Paying Living Expenses: Patient declined  Food Insecurity: Patient Declined (08/26/2023)   Hunger Vital Sign    Worried About Running Out of Food in the Last Year: Patient declined    Ran Out of Food in the Last Year: Patient declined  Transportation Needs: No Transportation Needs (08/26/2023)   PRAPARE - Administrator, Civil Service (Medical): No    Lack of Transportation (Non-Medical): No  Physical Activity: Insufficiently Active (08/26/2023)   Exercise Vital Sign    Days of Exercise per Week: 2 days    Minutes of Exercise per Session: 20 min  Stress: Stress Concern Present (08/26/2023)   [employer] of Occupational Health - Occupational Stress Questionnaire    Feeling of Stress : To some extent  Social Connections: Unknown (08/26/2023)   Social Connection and Isolation Panel [NHANES]    Frequency of Communication with Friends and Family: Twice a week    Frequency  of Social Gatherings with Friends and Family: Once a week    Attends Religious Services: Patient declined    Active Member of Clubs or Organizations: No    Attends Banker Meetings: Not on file    Marital Status: Married  Intimate Partner Violence: Not on file      08/26/2023    8:30 PM  Alcohol Use Disorder Test (AUDIT)  3. How often do you have six or more drinks on one occasion? 0   Family History  Problem Relation Age of Onset   Dementia Mother        early stage   Heart disease Father     Heart attack Father    Cancer Sister        throat   Esophageal cancer Sister    Cancer Sister    Colon cancer Brother    Diabetes Brother        diabetes type 1   Heart attack Brother    Diabetes Brother    Hypertension Brother    Obesity Brother    Colon polyps Neg Hx    Rectal cancer Neg Hx    Stomach cancer Neg Hx    Allergies  Allergen Reactions   Chlorthalidone     Not tolerated well.    Social History   Substance and Sexual Activity  Sexual Activity Yes   Comment: number of sex partners in the last 12 months 1    [name]   Social History   Tobacco Use   Smoking status: Never   Smokeless tobacco: Never  Vaping Use   Vaping status: Never Used  Substance Use Topics   Alcohol use: No   Drug use: No      08/27/2023    8:22 AM  Depression screen PHQ 2/9  Decreased Interest 0  Down, Depressed, Hopeless 0  PHQ - 2 Score 0    Objective  BP (!) 171/91   Pulse 90   Temp (!) 97.2 F (36.2 C) (Temporal)   Ht 5\' 11"  (1.803 m)   Wt (!) 334 lb 12.8 oz (151.9 kg)   SpO2 94%   BMI 46.70 kg/m  BP Readings from Last 3 Encounters:  08/27/23 (!) 171/91  02/07/23 116/84  11/28/22 (!) 149/91   Wt Readings from Last 10 Encounters:  08/27/23 (!) 334 lb 12.8 oz (151.9 kg)  02/07/23 (!) 315 lb (142.9 kg)  01/15/23 (!) 315 lb (142.9 kg)  11/28/22 (!) 310 lb 6.4 oz (140.8 kg)  07/26/22 (!) 308 lb (139.7 kg)  06/15/19 (!) 323 lb 5 oz (146.7 kg)  05/22/19 (!) 313 lb 3.2 oz (142.1 kg)  04/10/19 (!) 314 lb 6.4 oz (142.6 kg)  09/26/17 (!) 314 lb 6.4 oz (142.6 kg)  11/09/14 (!) 306 lb 12.8 oz (139.2 kg)    Physical Exam Physical Exam VITALS: BP- 170/ HEENT: Right ear with fluid behind tympanic membrane. Left tympanic membrane with scarring. Normal oropharynx. NECK: Thyroid without nodules. No axillary lymphadenopathy. CHEST: Clear to auscultation bilaterally. ABDOMEN: Normal. NEUROLOGICAL: Cranial nerves II-XII intact. Normal coordination and balance. SKIN: Skin  tags present, no suspicious lesions. Extensive sun damage on ears and back. GEN: No acute distress, resting comfortably. HEENT: Tympanic membranes normal appearing bilaterally, oropharynx clear, no thyromegaly noted, no palpable lymphadenopathy or thyroid nodules. CARDIOVASCULAR: S1 and S2 heart sounds with regular rate and rhythm, no murmurs appreciated. PULMONARY: Normal work of breathing, clear to auscultation bilaterally, no crackles, wheezes, or rhonchi. ABDOMEN: Soft, nontender, nondistended.  MSK: No edema, cyanosis, or clubbing noted. SKIN: Warm, dry. Numerous lesions photographed NEUROLOGICAL: Cranial nerves II-XII grossly intact, strength 5/5 in upper and lower extremities, reflexes symmetric and intact bilaterally. Good balance PSYCH: Normal affect and thought content, pleasant and cooperative.  Results Reviewed: Last depression screening scores    08/27/2023    8:22 AM 11/28/2022    8:05 AM 07/26/2022    8:24 AM  PHQ 2/9 Scores  PHQ - 2 Score 0 0 0  PHQ- 9 Score  2 4   Last fall risk screening    08/27/2023    8:22 AM  Fall Risk   Falls in the past year? 0  Number falls in past yr: 0  Injury with Fall? 0  Risk for fall due to : No Fall Risks  Follow up Falls evaluation completed   Last CBC Lab Results  Component Value Date   WBC 6.1 08/27/2023   HGB 16.5 08/27/2023   HCT 49.7 08/27/2023   MCV 97.5 08/27/2023   MCH 31.7 11/14/2012   RDW 13.2 08/27/2023   PLT 179.0 08/27/2023   Last metabolic panel Lab Results  Component Value Date   GLUCOSE 100 (H) 08/27/2023   NA 138 08/27/2023   K 4.0 08/27/2023   CL 101 08/27/2023   CO2 29 08/27/2023   BUN 13 08/27/2023   CREATININE 1.18 08/27/2023   GFR 66.45 08/27/2023   CALCIUM 9.6 08/27/2023   PROT 6.8 08/27/2023   ALBUMIN 4.3 08/27/2023   LABGLOB 2.7 04/10/2019   AGRATIO 1.8 04/10/2019   BILITOT 1.0 08/27/2023   ALKPHOS 65 08/27/2023   AST 19 08/27/2023   ALT 19 08/27/2023   Last lipids Lab Results   Component Value Date   CHOL 160 08/27/2023   HDL 41.90 08/27/2023   LDLCALC 98 08/27/2023   TRIG 102.0 08/27/2023   CHOLHDL 4 08/27/2023   Last hemoglobin A1c Lab Results  Component Value Date   HGBA1C 5.2 08/27/2023   Last thyroid functions Lab Results  Component Value Date   TSH 7.96 (H) 08/27/2023   T4TOTAL 7.3 11/20/2013   Last vitamin D Lab Results  Component Value Date   VD25OH 29 (L) 11/20/2013   Last vitamin B12 and Folate No results found for: "VITAMINB12", "FOLATE"      ======================================  Notes:  This document was synthesized by artificial intelligence ([employer]bridge) using HIPAA-compliant recording of the clinical interaction;   We discussed the use of AI scribe software for clinical note transcription with the patient, who gave verbal consent to proceed.    This encounter employed state-of-the-art, real-time, collaborative documentation. The patient was empowered to actively review and assist in updating their electronic medical record on a shared monitor, ensuring transparency and improving accuracy.    Prior to and at the beginning of Comprehensive Physical Exam (CPE) preventive care annual visit appointment types  we clarify to patients "Our goal today is to focus on your preventive or annual Comprehensive Physical Exam (CPE) preventive care annual visit, which typically covers routine screenings and overall health maintenance. However, if you share any new or concerning symptoms--such as dizziness, passing out, severe pain, or anything else that may point to a more serious issue--we are both legally and ethically required to evaluate it. We cannot simply overlook or ignore such concerns, even if you later decide you don't want to discuss them, because it could jeopardize your health.  If addressing a new concern takes [place] beyond the scope of the preventive visit, we may  need to bill separately for that portion of care. We understand financial  considerations are important, and we're happy to discuss your options if something new comes up. However, we want to be clear that once you mention a potentially serious issue, we must investigate it; we can't ethically or legally exclude that from our records or our evaluation. Please let us know all of your questions or worries. Together, we can decide how best to manage them and how to minimize any unexpected costs, but we want to keep you safe above all else."   This disclosure is mandated by professional ethics and legal obligations, as healthcare providers must address any substantial health concerns raised during any patient interaction and a comprehensive ROS is required by insurance companies for billing preventive-care visit type.    Signed: Lula Olszewski, MD  [facility] [address] [place], [place] [address] Office:  [phone]

## 2023-08-27 NOTE — Patient Instructions (Signed)
 Building Your Long-Term Health Plan  During today's preventive visit, we covered a variety of important health checks to help you stay on top of your well-being.  We also discussed strategies to maintain your health and identified some areas that might benefit from further exploration.   Preventive care visits like today's are designed to be proactive, but sometimes additional attention may be needed.  Rest assured, we're here for you.  If these areas require further evaluation or management, we'd be happy to schedule a separate, focused appointment to address them in detail.  Addressing Next Steps  [x]   Follow-up Visit: To ensure we address any unresolved issues and continue monitoring your overall health, we recommend scheduling a follow-up appointment in 1 year for your next preventive care visit. If you experience any new problems, need to discuss any medical concerns, or your condition worsens before then, please don't hesitate to call our office to schedule an appointment or seek emergency care as needed.  [x]   Preventive Measures: Maintaining healthy habits plays a crucial role in overall wellness. We recommend considering these tips: [x]   Regular appointments with dental and vision professionals [x]   Nightly nasal saline mist to keep sinuses clear [x]   Consistent toothbrushing to maintain oral health [x]   Using an app like SnoreLab to track sleep quality [x]   Routine checks of blood pressure and heart rate [x]   Medical Information: In some instances, we may require additional medical information from other providers to create a comprehensive picture of your health. If applicable, we can provide a medical information release form at the front desk for you to sign, allowing [place] to gather these records. [x]   Lab Tests: If any lab tests were ordered today, scheduling them within a week of your visit helps ensure the best possible insurance coverage.  Planning Follow Up to Work on a Problem? Make  the Most of Our Focused (20 minute) Appointments  [x]   Clearly state your top concerns at the beginning of the visit to focus our discussion [x]   If you anticipate you will need more time, please inform the front desk during scheduling - we can book multiple appointments in the same week. [x]   If you have transportation problems- use our convenient video appointments or ask about transportation support. [x]   We can get down to business faster if you use MyChart to update information before the visit and submit non-urgent questions before your visit. Thank you for taking the time to provide details through MyChart.  Let our nurse know and she can import this information into your encounter documents.  Arrival and Wait Times  [x]   Arriving on time ensures that everyone receives prompt attention. [x]   Early morning (8a) and afternoon (1p) appointments tend to have shortest wait times. [x]   Unfortunately, we cannot delay appointments for late arrivals or hold slots during phone calls.  Bring to Your Next Appointment:  [x]   Medications: Please bring all your medication bottles to your next appointment to ensure we have an accurate record of your prescriptions. [x]   Health Diaries: If you're monitoring any health conditions at home, keeping a diary of your readings can be very helpful for discussions at your next appointment.  Reviewing Your Records  [x]   Review your attached preventive care information at the end of these patient instructions. [x]   Review this early draft of your clinical encounter notes below and the final encounter summary tomorrow on MyChart after its been completed.      Getting Answers and  Following Up  [x]   Simple Questions & Concerns: For quick questions or basic follow-up after your visit, reach [place] at [phone] or MyChart messaging. [x]   Complex Concerns: If your concern is more complex, scheduling an appointment might be best. Discuss this with the staff to find  the most suitable option. [x]   Lab & Imaging Results: We'll contact you directly if results are abnormal or you don't use MyChart. Most normal results will be on MyChart within 2-3 business days, with a review message from Dr. Jon Billings. Haven't heard back in 2 weeks? Need results sooner? Contact us at [phone]. [x]   Referrals: Our referral coordinator will manage specialist referrals. The specialist's office should contact you within 2 weeks to schedule an appointment. Call us if you haven't heard from them after 2 weeks.  Staying Connected  [x]   MyChart: Activate your MyChart for the fastest way to access results and message [place]. See the last page of this paperwork for instructions on how to activate.  Billing  [x]   X-ray & Lab Orders: These are billed by separate companies. Contact the invoicing company directly for questions or concerns. [x]   Visit Charges: Discuss any billing inquiries with our administrative services team.  Your Satisfaction Matters  [x]   Share Your Experience: We strive for your satisfaction! If you have any complaints, or preferably compliments, please let Dr. Jon Billings know directly or contact our Practice Administrators, Edwena Felty or [employer], by asking at the front desk.                 Next Steps  [x]   Schedule Follow-Up:  We recommend a follow-up appointment in 1 year for your next wellness visit.  If you develop any new problems, want to address any medical issues, or your condition worsens before then, please call us for an appointment or seek emergency care. [x]   Preventive Care:  Make sure to keep regular appointments with dental and vision professionals, use nightly nasal saline mist sprays to keep your sinuses clear and toothbrushing to protect your teeth. Use SnoreLab App or other app to track your sleep quality. Check blood pressure and heart rate routinely. [x]   Medical Information Release:  For any relevant medical information we  don't have, please sign a release form at the front desk so we can obtain it for your records. [x]   Lab Tests:  Schedule any lab tests from today for within a week to ensure best insurance coverage.    Making the Most of Our Focused (20 minute) Appointments:  [x]   Clearly state your top concerns at the beginning of the visit to focus our discussion [x]   If you anticipate you will need more time, please inform the front desk during scheduling - we can book multiple appointments in the same week. [x]   If you have transportation problems- use our convenient video appointments or ask about transportation support. [x]   We can get down to business faster if you use MyChart to update information before the visit and submit non-urgent questions before your visit. Thank you for taking the time to provide details through MyChart.  Let our nurse know and she can import this information into your encounter documents.  Arrival and Wait Times: [x]   Arriving on time ensures that everyone receives prompt attention. [x]   Early morning (8a) and afternoon (1p) appointments tend to have shortest wait times. [x]   Unfortunately, we cannot delay appointments for late arrivals or hold slots during phone calls.  Bring to Your  Next Appointment  [x]   Medications: Please bring all your medication bottles to your next appointment to ensure we have an accurate record of your prescriptions. [x]   Health Diaries: If you're monitoring any health conditions at home, keeping a diary of your readings can be very helpful for discussions at your next appointment.  Reviewing Your Records  [x]   Review your attached preventive care information at the end of these patient instructions. [x]   Review this early draft of your clinical encounter notes below and the final encounter summary tomorrow on MyChart after its been completed.   Encounter for annual health examination -     CBC with Differential/Platelet -     Comprehensive  metabolic panel -     Lipid panel -     TSH -     Hemoglobin A1c -     PSA  Severe obesity (BMI >= 40) (HCC) -     TSH -     Hemoglobin A1c  Screening for malignant neoplasm of prostate  History of colon polyps  Melanocytic neoplasm of skin  Dry ear canal, bilateral  Fluid level behind tympanic membrane of right ear  Tympanosclerosis, left ear     Getting Answers and Following Up  [x]   Simple Questions & Concerns: For quick questions or basic follow-up after your visit, reach [place] at [phone] or MyChart messaging. [x]   Complex Concerns: If your concern is more complex, scheduling an appointment might be best. Discuss this with the staff to find the most suitable option. [x]   Lab & Imaging Results: We'll contact you directly if results are abnormal or you don't use MyChart. Most normal results will be on MyChart within 2-3 business days, with a review message from Dr. Jon Billings. Haven't heard back in 2 weeks? Need results sooner? Contact us at [phone]. [x]   Referrals: Our referral coordinator will manage specialist referrals. The specialist's office should contact you within 2 weeks to schedule an appointment. Call us if you haven't heard from them after 2 weeks.  Staying Connected  [x]   MyChart: Activate your MyChart for the fastest way to access results and message [place]. See the last page of this paperwork for instructions on how to activate.  Billing  [x]   X-ray & Lab Orders: These are billed by separate companies. Contact the invoicing company directly for questions or concerns. [x]   Visit Charges: Discuss any billing inquiries with our administrative services team.  Your Satisfaction Matters  [x]   Share Your Experience: We strive for your satisfaction! If you have any complaints, or preferably compliments, please let Dr. Jon Billings know directly or contact our Practice Administrators, Edwena Felty or [employer], by asking at the front desk.

## 2023-08-27 NOTE — Assessment & Plan Note (Signed)
 Took images to monitor over time extensive sun damaged skin  Photographs Taken 08/27/2023 :

## 2023-09-09 ENCOUNTER — Encounter: Payer: Self-pay | Admitting: Internal Medicine

## 2023-10-15 ENCOUNTER — Encounter: Payer: Self-pay | Admitting: Internal Medicine

## 2023-10-15 ENCOUNTER — Ambulatory Visit (INDEPENDENT_AMBULATORY_CARE_PROVIDER_SITE_OTHER): Admitting: Internal Medicine

## 2023-10-15 VITALS — BP 142/90 | HR 89 | Temp 98.0°F | Ht 71.0 in | Wt 329.8 lb

## 2023-10-15 DIAGNOSIS — E038 Other specified hypothyroidism: Secondary | ICD-10-CM | POA: Diagnosis not present

## 2023-10-15 DIAGNOSIS — I10 Essential (primary) hypertension: Secondary | ICD-10-CM | POA: Diagnosis not present

## 2023-10-15 DIAGNOSIS — G4733 Obstructive sleep apnea (adult) (pediatric): Secondary | ICD-10-CM

## 2023-10-15 LAB — THYROID PANEL WITH TSH
Free Thyroxine Index: 1.2 — ABNORMAL LOW (ref 1.4–3.8)
T3 Uptake: 31 % (ref 22–35)
T4, Total: 4 ug/dL — ABNORMAL LOW (ref 4.9–10.5)
TSH: 14.53 m[IU]/L — ABNORMAL HIGH (ref 0.40–4.50)

## 2023-10-15 MED ORDER — AMLODIPINE BESYLATE 5 MG PO TABS: 5.0000 mg | ORAL_TABLET | Freq: Every day | ORAL | 3 refills | Status: AC

## 2023-10-15 NOTE — Patient Instructions (Signed)
 YOUR AFTER VISIT SUMMARY - Oct 15, 2023  Today we discussed: Thyroid  function, blood pressure management, sleep apnea, and weight management Changes Made Today Details  Medication Change Increased amlodipine  from 2.5 mg to 5 mg daily  Tests Ordered Thyroid  Panel with TSH, Anti-TPO antibodies  IMPORTANT FINDINGS: Your thyroid  stimulating hormone (TSH) level is elevated at 7.96, suggesting your thyroid  may not be producing enough hormone. Your blood pressure readings today were 142/90 and 146/90, indicating your current medication dose is not providing adequate control.  YOUR CARE PLAN   Thyroid  Function Complete the ordered thyroid  tests as soon as possible Monitor for symptoms: increasing fatigue, sensitivity to cold, constipation, dry skin We will discuss potential thyroid  medication once all test results are available  Blood Pressure Take amlodipine  5 mg once daily with or without food Check your blood pressure daily and record readings Aim for readings below 130/80 Reduce sodium intake to less than 2,000 mg daily Follow DASH diet principles (fruits, vegetables, whole grains, lean proteins)  Sleep Apnea Use your CPAP machine every night Check mask fit and seal - consider contacting equipment provider for adjustment Consider purchasing a home pulse oximeter to monitor oxygen levels during sleep Schedule appointment with sleep specialist for re-evaluation  Weight Management Goal: 5-10% weight reduction over next 6 months (about 16-33 pounds) Focus on portion control and reduced calorie intake Aim for 30 minutes of moderate activity at least 5 days per week Start with walking program - gradually increase duration and intensity   SEEK IMMEDIATE MEDICAL ATTENTION if you experience: Severe headache with vision changes or confusion Chest pain or severe shortness of breath Blood pressure reading above 180/120 Severe dizziness or fainting    FOLLOW-UP APPOINTMENTS: Type When Purpose  Office  Visit In 2 weeks Review thyroid  test results and blood pressure response  Sleep Medicine Schedule within 1 month Reassess sleep apnea and CPAP effectiveness  Comprehensive Visit 3 months Review overall progress with all conditions   HELPFUL RESOURCES: ??? DASH Diet Information - GraduateSites.it ?? Sleep Apnea Association - PaidValue.at ?? Thyroid  Foundation - www.thyroid .org/hypothyroidism/ ?? Blood Pressure Tracking Apps - Search for "BP Tracker" in your app store

## 2023-10-15 NOTE — Assessment & Plan Note (Signed)
 Assessment: BMI 46.02 with weight 329 lbs, contributing to multiple health conditions including hypertension, OSA, and metabolic syndrome. History of weight management challenges, possibly complicated by evolving hypothyroidism.        Plan:    - have discussed comprehensive weight management approach including dietary modification and physical activity    - Will reassess weight management strategies after addressing thyroid  status    - Provided educational resources    - Consider referral for medical weight management program at next visit    - Set target of 5-10% weight reduction over next 6 months  Follow-up: Return visit in 2 weeks to review thyroid  panel results and blood pressure response to increased amlodipine . Longer comprehensive visit in 3 months to assess overall progress with all conditions.

## 2023-10-15 NOTE — Assessment & Plan Note (Signed)
 Obstructive Sleep Apnea, Inadequately Treated (G47.33)    Assessment: History of diagnosed OSA with reported issues with CPAP mask seal and no specialist follow-up in over 2 years. Poor CPAP compliance/efficacy likely contributing to hypertension and fatigue.        Plan:    - Referred to sleep medicine for comprehensive re-evaluation    - Recommended purchase of overnight pulse oximeter for home monitoring    - Discussed options for mask refitting and potential equipment updates    - Provided resources for CPAP mask troubleshooting and maintenance    - Patient educated on relationship between untreated OSA and cardiovascular risk    - Will reassess impact of improved CPAP compliance on blood pressure at next visit

## 2023-10-15 NOTE — Progress Notes (Signed)
 ==============================  [facility]: [phone]   -- Medical Office Visit --  Patient: Steven Simpson      Age: 63 y.o.       Sex:  male  Date:   10/15/2023 Today's Healthcare Provider: Anthon Kins, MD  ==============================   Chief Complaint: Discuss lab results  Discussed the use of AI scribe software for clinical note transcription with the patient, who gave verbal consent to proceed.  History of Present Illness 63 year old male with significant medical history of hypertension, OSA, metabolic syndrome, and obesity (BMI 46) presenting for evaluation of thyroid  function and blood pressure management.  THYROID  CONCERNS: Patient reports mild fatigue and difficulty with weight management but denies classic hypothyroid symptoms such as cold intolerance, constipation, or anxiety. Recent TSH is significantly elevated at 7.96 (Ref: 0.35-5.50), with progressively increasing levels over previous testing (2.82 in 2024, 3.81 in 2020, 3.18 in 2015). Prior T4 levels from 2015 were within normal range at 7.3 (Ref: 5.0-12.5). Patient has never undergone antibody testing for autoimmune thyroid  disease. Family history notable for autoimmune disease (brother with type 1 diabetes), though no known thyroid  disorders. No history of thyroid  medication use, radiation exposure, thyroid  surgery, or use of medications known to affect thyroid  function.  HYPERTENSION: Patient currently monitors blood pressure at home with recent reading of 141/87 mmHg. Previously prescribed amlodipine  2.5 mg daily with inconsistent control. Vitals during today's visit show BP 142/90 and 146/90, reflecting poor control. Patient has not experienced symptoms related to hypertension but expresses concern about management. Previously diagnosed with secondary hypertension, potentially related to severe obesity (BMI 46.02) and untreated OSA.  SLEEP APNEA: Diagnosed with OSA but has  not followed up with sleep specialist in over two years. Reports concerns about CPAP mask not sealing properly, potentially compromising treatment efficacy. No formal reassessment of CPAP settings or equipment has been performed since initial setup. Inadequate CPAP therapy likely contributing to poor blood pressure control and fatigue.  RELEVANT HISTORY: Patient has metabolic syndrome with low HDL (41.9), borderline glucose (100), and obesity. Vasectomy in 2012 and history of colon polyps with prior colonoscopy. Strong family history of cardiovascular disease and diabetes. Allergic to chlorthalidone . Non-smoker, denies alcohol consumption. Interim history: fatigue, weight gain, swelling    Latest Ref Rng & Units 08/27/2023    9:33 AM 08/03/2022   10:06 AM 04/10/2019    4:07 PM 11/20/2013    4:46 PM  THYROID   TSH 0.35 - 5.50 uIU/mL 7.96  2.82  3.810  3.184   Thyroxine (T4) 5.0 - 12.5 ug/dL    7.3   T3 Uptake 56.2 - 37.0 %    34.7    Has Never had done: anti-TPO antibodies: N/A  anti-Tg antibodies: N/A  Anti-TSI antibodies: N/A   Family history of thyroid  disease: Patient last evaluated for thyroid  nodules: Last ultrasound for thyroid  nodules: N  Last heart exam for rhythm check:     Background Reviewed: Problem List: has Secondary hypertension; Hypertrophy of prostate without urinary obstruction and other lower urinary tract symptoms (LUTS); Severe obesity (BMI >= 40) (HCC); OSA (obstructive sleep apnea); Metabolic syndrome; At risk for heart disease; Low HDL (under 40); Hyperlipidemia, acquired; History of colon polyps; Melanocytic neoplasm of skin; Dry ear canal, bilateral; Fluid level behind tympanic membrane of right ear; and Tympanosclerosis, left ear on their problem list. Past Medical History:  has a past medical history of Allergy, Hypertension, Pre-hypertension, and Sleep apnea. Past Surgical History:   has a past  surgical history that includes Vasectomy (06/04/2010) and  Colonoscopy. Social History:   reports that he has never smoked. He has never used smokeless tobacco. He reports that he does not drink alcohol and does not use drugs. Family History:  family history includes Cancer in his sister and sister; Colon cancer in his brother; Dementia in his mother; Diabetes in his brother and brother; Esophageal cancer in his sister; Heart attack in his brother and father; Heart disease in his father; Hypertension in his brother; Obesity in his brother. Allergies:  is allergic to chlorthalidone .   Medication Reconciliation: No current outpatient medications on file prior to visit.   No current facility-administered medications on file prior to visit.   Medications Discontinued During This Encounter  Medication Reason   amLODipine  (NORVASC ) 2.5 MG tablet      Physical Exam:    10/15/2023    8:10 AM 10/15/2023    8:03 AM 08/27/2023    8:26 AM  Vitals with BMI  Height  5\' 11"    Weight  329 lbs 13 oz   BMI  46.02   Systolic 142 146 161  Diastolic 90 90 91  Pulse  89   Vital signs reviewed.  Nursing notes reviewed. Weight trend reviewed.  GENERAL APPEARANCE: Well-developed, well-nourished 62 year old male in no acute distress. Alert and oriented with appropriate affect. Well-groomed with good hygiene. Moves independently without assistance despite morbid obesity.  SKIN: No jaundice, cyanosis, or pallor. No rashes or lesions noted. Skin turgor normal without evidence of myxedema or pretibial myxedema. No acanthosis nigricans observed at neck or axillary regions.  HEENT: Normocephalic, atraumatic. Extraocular movements intact. Pupils equal, round, and reactive to light. Oropharynx clear without evidence of macroglossia  NECK: Supple with full range of motion.Thyroid  gland normal in size, symmetrical, and non-tender without palpable nodules or enlargement. No cervical lymphadenopathy.  CARDIOVASCULAR:. No apparent peripheral edema noted despite reported history  of leg swelling.  RESPIRATORY: Unlabored breathing with normal work of respiration No increased AP diameter or use of accessory muscles.  ABDOMINAL: Obese abdomen with central adiposity. Soft and non-tender to palpation. No hepatosplenomegaly appreciated, though examination limited by body habitus. No palpable masses. Bowel sounds present in all quadrants.  MUSCULOSKELETAL: Normal muscle tone and strength (5/5) in all extremities. No joint deformities, tenderness, or limitations in range of motion. No evidence of proximal muscle weakness that might suggest myopathy associated with thyroid  dysfunction.  NEUROLOGICAL: Alert and oriented to person, place, time, and situation. Cranial nerves II-XII grossly intact. No focal deficits. Normal gait with appropriate speed and stability. Speech clear, fluent, and coherent with normal rate, rhythm, and volume.  PSYCHIATRIC: Euthymic mood with appropriate and congruent affect. Thought process logical and goal-directed. Normal thought content. No evidence of depression or anxiety. Engaged appropriately throughout the examination.  LYMPHATIC: No cervical lymphadenopathy.  ENDOCRINE: No clinical manifestations of overt thyroid  dysfunction such as exophthalmos, lid lag, tremor  Last CBC Lab Results  Component Value Date   WBC 6.1 08/27/2023   HGB 16.5 08/27/2023   HCT 49.7 08/27/2023   MCV 97.5 08/27/2023   MCH 31.7 11/14/2012   RDW 13.2 08/27/2023   PLT 179.0 08/27/2023   Last metabolic panel Lab Results  Component Value Date   GLUCOSE 100 (H) 08/27/2023   NA 138 08/27/2023   K 4.0 08/27/2023   CL 101 08/27/2023   CO2 29 08/27/2023   BUN 13 08/27/2023   CREATININE 1.18 08/27/2023   GFR 66.45 08/27/2023   CALCIUM 9.6 08/27/2023  PROT 6.8 08/27/2023   ALBUMIN 4.3 08/27/2023   LABGLOB 2.7 04/10/2019   AGRATIO 1.8 04/10/2019   BILITOT 1.0 08/27/2023   ALKPHOS 65 08/27/2023   AST 19 08/27/2023   ALT 19 08/27/2023   Last lipids Lab  Results  Component Value Date   CHOL 160 08/27/2023   HDL 41.90 08/27/2023   LDLCALC 98 08/27/2023   TRIG 102.0 08/27/2023   CHOLHDL 4 08/27/2023   Last hemoglobin A1c Lab Results  Component Value Date   HGBA1C 5.2 08/27/2023   Last thyroid  functions Lab Results  Component Value Date   TSH 7.96 (H) 08/27/2023   T4TOTAL 7.3 11/20/2013   Last vitamin D  Lab Results  Component Value Date   VD25OH 29 (L) 11/20/2013   Last vitamin B12 and Folate No results found for: "VITAMINB12", "FOLATE"  No results found for any visits on 10/15/23. Office Visit on 08/27/2023  Component Date Value   WBC 08/27/2023 6.1    RBC 08/27/2023 5.10    Hemoglobin 08/27/2023 16.5    HCT 08/27/2023 49.7    MCV 08/27/2023 97.5    MCHC 08/27/2023 33.1    RDW 08/27/2023 13.2    Platelets 08/27/2023 179.0    Neutrophils Relative % 08/27/2023 60.1    Lymphocytes Relative 08/27/2023 29.9    Monocytes Relative 08/27/2023 7.2    Eosinophils Relative 08/27/2023 2.3    Basophils Relative 08/27/2023 0.5    Neutro Abs 08/27/2023 3.7    Lymphs Abs 08/27/2023 1.8    Monocytes Absolute 08/27/2023 0.4    Eosinophils Absolute 08/27/2023 0.1    Basophils Absolute 08/27/2023 0.0    Sodium 08/27/2023 138    Potassium 08/27/2023 4.0    Chloride 08/27/2023 101    CO2 08/27/2023 29    Glucose, Bld 08/27/2023 100 (H)    BUN 08/27/2023 13    Creatinine, Ser 08/27/2023 1.18    Total Bilirubin 08/27/2023 1.0    Alkaline Phosphatase 08/27/2023 65    AST 08/27/2023 19    ALT 08/27/2023 19    Total Protein 08/27/2023 6.8    Albumin 08/27/2023 4.3    GFR 08/27/2023 66.45    Calcium 08/27/2023 9.6    Cholesterol 08/27/2023 160    Triglycerides 08/27/2023 102.0    HDL 08/27/2023 41.90    VLDL 08/27/2023 20.4    LDL Cholesterol 08/27/2023 98    Total CHOL/HDL Ratio 08/27/2023 4    NonHDL 08/27/2023 118.56    TSH 08/27/2023 7.96 (H)    Hgb A1c MFr Bld 08/27/2023 5.2    PSA 08/27/2023 1.65   No image results  found. No results found.      08/27/2023    8:22 AM 11/28/2022    8:05 AM 07/26/2022    8:24 AM 05/22/2019    1:57 PM  PHQ 2/9 Scores  PHQ - 2 Score 0 0 0 0  PHQ- 9 Score  2 4   \   Assessment & Plan Subclinical hypothyroidism    Assessment: TSH significantly elevated at 7.96 uIU/mL with progressive increase over time (2.82 in 2024, 3.81 in 2020, 3.18 in 2015). Patient reports mild fatigue and weight management difficulties but lacks classic hypothyroid symptoms. Physical exam reveals no thyroid  enlargement or nodules. Family history significant for autoimmune disease (brother with type 1 diabetes), increasing likelihood of autoimmune thyroiditis as etiology.        Plan:    - Ordered comprehensive thyroid  panel including Free T4 and Anti-TPO antibodies to confirm diagnosis and assess for  Hashimoto's thyroiditis    - Discussed natural history of subclinical hypothyroidism, with 2-5% annual progression to overt hypothyroidism    - If Free T4 normal with positive antibodies, will consider levothyroxine given TSH >7.0 and presence of mild symptoms    - If antibodies negative with normal Free T4, will repeat thyroid  testing in 3 months to monitor for progression    - Patient educated on symptoms to monitor including worsening fatigue, cold intolerance, constipation, or cognitive changes    - Follow-up in 2 weeks to review lab results and determine treatment plan Hypertension, unspecified type   Assessment: Consistently elevated blood pressure with readings of 142/90 and 146/90 during visit, home reading 141/87. Current therapy with amlodipine  2.5mg  daily providing inadequate control. Contributing factors include severe obesity (BMI 46.02), untreated OSA, and possible subclinical hypothyroidism.        Plan:    - Increased amlodipine  to 5mg  daily to improve blood pressure control    - Prescribed 90-day supply with 3 refills    - Recommended daily home blood pressure monitoring with log    -  Discussed dietary approaches (DASH diet) and sodium restriction (<2g daily)    - Reviewed importance of weight management and consistent physical activity    - Target BP <130/80 given metabolic syndrome risk factors    - Follow-up BP check in 2 weeks with medication adjustment if needed OSA (obstructive sleep apnea) Obstructive Sleep Apnea, Inadequately Treated (G47.33)    Assessment: History of diagnosed OSA with reported issues with CPAP mask seal and no specialist follow-up in over 2 years. Poor CPAP compliance/efficacy likely contributing to hypertension and fatigue.        Plan:    - Referred to sleep medicine for comprehensive re-evaluation    - Recommended purchase of overnight pulse oximeter for home monitoring    - Discussed options for mask refitting and potential equipment updates    - Provided resources for CPAP mask troubleshooting and maintenance    - Patient educated on relationship between untreated OSA and cardiovascular risk    - Will reassess impact of improved CPAP compliance on blood pressure at next visit Severe obesity (BMI >= 40) (HCC)    Assessment: BMI 46.02 with weight 329 lbs, contributing to multiple health conditions including hypertension, OSA, and metabolic syndrome. History of weight management challenges, possibly complicated by evolving hypothyroidism.        Plan:    - have discussed comprehensive weight management approach including dietary modification and physical activity    - Will reassess weight management strategies after addressing thyroid  status    - Provided educational resources    - Consider referral for medical weight management program at next visit    - Set target of 5-10% weight reduction over next 6 months  Follow-up: Return visit in 2 weeks to review thyroid  panel results and blood pressure response to increased amlodipine . Longer comprehensive visit in 3 months to assess overall progress with all conditions.   Medical Decision  Making: 2 or more stable chronic illnesses Prescription drug management         Orders Placed During this Encounter:   Orders Placed This Encounter  Procedures   Thyroid  Panel With TSH   Anti-TPO Ab (RDL)   Meds ordered this encounter  Medications   amLODipine  (NORVASC ) 5 MG tablet    Sig: Take 1 tablet (5 mg total) by mouth daily.    Dispense:  90 tablet    Refill:  3  ED Discharge Orders          Ordered    Thyroid  Panel With TSH        10/15/23 0825    Anti-TPO Ab (RDL)        10/15/23 0825    amLODipine  (NORVASC ) 5 MG tablet  Daily        10/15/23 0825              This document was synthesized by artificial intelligence ([employer]) using HIPAA-compliant recording of the clinical interaction;   We discussed the use of AI scribe software for clinical note transcription with the patient, who gave verbal consent to proceed. additional Info: This encounter employed state-of-the-art, real-time, collaborative documentation. The patient actively reviewed and assisted in updating their electronic medical record on a shared screen, ensuring transparency and facilitating joint problem-solving for the problem list, overview, and plan. This approach promotes accurate, informed care. The treatment plan was discussed and reviewed in detail, including medication safety, potential side effects, and all patient questions. We confirmed understanding and comfort with the plan. Follow-up instructions were established, including contacting the office for any concerns, returning if symptoms worsen, persist, or new symptoms develop, and precautions for potential emergency department visits.

## 2023-10-16 ENCOUNTER — Ambulatory Visit: Payer: Self-pay | Admitting: Internal Medicine

## 2023-10-16 ENCOUNTER — Other Ambulatory Visit: Payer: Self-pay

## 2023-10-16 DIAGNOSIS — E039 Hypothyroidism, unspecified: Secondary | ICD-10-CM

## 2023-10-16 DIAGNOSIS — E063 Autoimmune thyroiditis: Secondary | ICD-10-CM

## 2023-10-16 DIAGNOSIS — E038 Other specified hypothyroidism: Secondary | ICD-10-CM

## 2023-10-16 MED ORDER — LEVOTHYROXINE SODIUM 50 MCG PO TABS: 50.0000 ug | ORAL_TABLET | Freq: Every day | ORAL | 3 refills | Status: AC

## 2023-10-16 NOTE — Progress Notes (Signed)
 Please place the following orders Dx: E03.9 (Hypothyroidism, unspecified) as patient agrees  TSH + free T4 in six weeks (future order) - levothyroxine 50 mcg by mouth daily   Counseling points  o Take on an empty stomach with water, 30 min before breakfast or three hours after last meal.  o Separate from calcium, iron, PPIs, and multivitamins by >=4 h.  o Stress lifelong therapy and yearly TSH once stable.  Also: add a telephone encounter in six weeks to remind patient of follow-up labs, and schedule a 15-minute visit with me one week after those labs result.

## 2023-10-25 DIAGNOSIS — E039 Hypothyroidism, unspecified: Secondary | ICD-10-CM | POA: Insufficient documentation

## 2023-10-25 DIAGNOSIS — E063 Autoimmune thyroiditis: Secondary | ICD-10-CM | POA: Insufficient documentation

## 2023-10-25 LAB — ANTI-TPO AB (RDL): Anti-TPO Ab (RDL): 1639.1 [IU]/mL — ABNORMAL HIGH (ref <9.0)

## 2023-11-03 ENCOUNTER — Encounter: Payer: Self-pay | Admitting: Internal Medicine

## 2023-11-03 DIAGNOSIS — E063 Autoimmune thyroiditis: Secondary | ICD-10-CM

## 2023-11-05 NOTE — Telephone Encounter (Signed)
 Having christie front desk call pt to make lab appt for 6 weeks out.

## 2023-11-27 ENCOUNTER — Ambulatory Visit: Admitting: Internal Medicine

## 2023-12-06 ENCOUNTER — Other Ambulatory Visit: Payer: Self-pay | Admitting: Internal Medicine

## 2023-12-06 DIAGNOSIS — I159 Secondary hypertension, unspecified: Secondary | ICD-10-CM

## 2023-12-17 ENCOUNTER — Other Ambulatory Visit (INDEPENDENT_AMBULATORY_CARE_PROVIDER_SITE_OTHER)

## 2023-12-17 ENCOUNTER — Ambulatory Visit: Payer: Self-pay | Admitting: Internal Medicine

## 2023-12-17 DIAGNOSIS — E038 Other specified hypothyroidism: Secondary | ICD-10-CM | POA: Diagnosis not present

## 2023-12-17 DIAGNOSIS — E039 Hypothyroidism, unspecified: Secondary | ICD-10-CM | POA: Diagnosis not present

## 2023-12-17 DIAGNOSIS — E063 Autoimmune thyroiditis: Secondary | ICD-10-CM | POA: Diagnosis not present

## 2023-12-17 LAB — TSH: TSH: 6.36 u[IU]/mL — ABNORMAL HIGH (ref 0.35–5.50)

## 2023-12-17 LAB — T4, FREE: Free T4: 0.61 ng/dL (ref 0.60–1.60)

## 2023-12-17 NOTE — Progress Notes (Signed)
 Results Reviewed:      TSH: 6.36 uIU/mL (Ref: 0.35-5.50) - Elevated     Free T4: 0.61 ng/dL (Ref: 9.39-8.39) - Low-normal  Clinical Context: Patient is a 63 year old male with recently diagnosed primary hypothyroidism (Hashimoto's), started on levothyroxine  50 mcg daily on 10/16/23. These are the first follow-up thyroid  labs since initiation of therapy.  Interpretation:      TSH remains above target range, and free T4 is at the lower end of normal, indicating that the current levothyroxine  dose is not yet sufficient to restore normal thyroid  function.     This is a common finding at this stage, as it often takes several adjustments to achieve the optimal dose.     No evidence of overtreatment.  Plan:      Patient notified via MyChart message to schedule a follow-up appointment within the next 1-2 weeks to discuss results, assess symptoms, and plan for medication adjustment.     Anticipate increasing levothyroxine  dose at next visit, with repeat TSH and free T4 6 weeks after any dose change.     Patient advised to contact clinic sooner if experiencing new or worsening symptoms (e.g., severe fatigue, palpitations, chest pain, shortness of breath, or significant weight changes).  Follow-Up:      Awaiting patient response to MyChart message.     Will ensure timely follow-up and dose adjustment per standard hypothyroidism management guidelines.  Provider Electronic Signature Bernardino Cone, MD

## 2023-12-18 LAB — T4, FREE: Free T4: 0.9 ng/dL (ref 0.8–1.8)

## 2023-12-18 LAB — TSH+FREE T4: TSH W/REFLEX TO FT4: 6.62 m[IU]/L — ABNORMAL HIGH (ref 0.40–4.50)

## 2023-12-19 NOTE — Telephone Encounter (Signed)
 Spoke with pt via phone about labs understood well he will call back to make appt with pcp

## 2024-02-06 ENCOUNTER — Ambulatory Visit (INDEPENDENT_AMBULATORY_CARE_PROVIDER_SITE_OTHER): Admitting: Internal Medicine

## 2024-02-06 ENCOUNTER — Encounter: Payer: Self-pay | Admitting: Internal Medicine

## 2024-02-06 DIAGNOSIS — I10 Essential (primary) hypertension: Secondary | ICD-10-CM

## 2024-02-06 DIAGNOSIS — E063 Autoimmune thyroiditis: Secondary | ICD-10-CM

## 2024-02-06 MED ORDER — TIRZEPATIDE-WEIGHT MANAGEMENT 2.5 MG/0.5ML ~~LOC~~ SOAJ: 2.5000 mg | SUBCUTANEOUS | 11 refills | Status: AC

## 2024-02-06 NOTE — Progress Notes (Signed)
 ==============================  [facility]: [phone]   -- Medical Office Visit --  Patient: Steven Simpson      Age: 63 y.o.       Sex:  male  Date:   02/06/2024 Today's Healthcare Provider: Bernardino KANDICE Cone, MD  ==============================   Chief Complaint: Hypothyroidism Weight management BMI 45  Discussed the use of AI scribe software for clinical note transcription with the patient, who gave verbal consent to proceed.  History of Present Illness 63 year old male with obesity, sleep apnea, and hypothyroidism who presents for weight management and thyroid  evaluation.  He is seeking assistance with weight management. He is exploring medication options for weight loss, particularly those that are newly affordable, and is concerned about potential gastrointestinal side effects such as nausea and constipation. He has not taken weight loss medications before and is considering options like tirzepatide .  He has a history of sleep apnea, confirmed by a sleep study, and experiences knee pain when using staircases, which he attributes to his weight. His weight limits his ability to exercise, particularly running, and he has tried various diets, including limiting carbohydrates and calories, but has not participated in [employer].  He is currently taking medication for hypothyroidism, which he takes daily in the morning. He is unsure of the exact dosage but believes it is either 50 or 75 mcg. No unusual symptoms related to his thyroid  condition. No current knee pain except when using staircases. Reports sleep apnea. He does not recall noticing any unusual or prohibitive symptoms related to his thyroid  condition.     Latest Ref Rng & Units 12/17/2023    8:28 AM 10/15/2023    8:27 AM 08/27/2023    9:33 AM 08/03/2022   10:06 AM 04/10/2019    4:07 PM 11/20/2013    4:46 PM  THYROID   TSH 0.35 - 5.50 uIU/mL 6.36  14.53  7.96  2.82  3.810   3.184   Thyroxine (T4) 4.9 - 10.5 mcg/dL  4.0     7.3   U5,Qmzz(Ipmzru) 0.60 - 1.60 ng/dL 0.8 - 1.8 ng/dL 9.38    0.9        T3 Uptake 22 - 35 %  31     34.7   Taking 50 mcg levothyroxine  with blood pressure medication(s)- no side effect(s) or signs & symptoms thyroid  disease   Background Reviewed: Problem List: has Secondary hypertension; Hypertrophy of prostate without urinary obstruction and other lower urinary tract symptoms (LUTS); Severe obesity (BMI >= 40) (HCC); OSA (obstructive sleep apnea); Metabolic syndrome; At risk for heart disease; Low HDL (under 40); Hyperlipidemia, acquired; History of colon polyps; Melanocytic neoplasm of skin; Dry ear canal, bilateral; Fluid level behind tympanic membrane of right ear; Tympanosclerosis, left ear; Hashimoto thyroiditis; and Primary hypothyroidism on their problem list. Past Medical History:  has a past medical history of Allergy, Hypertension, Pre-hypertension, and Sleep apnea. Past Surgical History:   has a past surgical history that includes Vasectomy (06/04/2010) and Colonoscopy. Social History:   reports that he has never smoked. He has never used smokeless tobacco. He reports that he does not drink alcohol and does not use drugs. Family History:  family history includes Cancer in his sister and sister; Colon cancer in his brother; Dementia in his mother; Diabetes in his brother and brother; Esophageal cancer in his sister; Heart attack in his brother and father; Heart disease in his father; Hypertension in his brother; Obesity in his brother. Allergies:  is allergic to chlorthalidone .   Medication Reconciliation: Current Outpatient Medications on File Prior to Visit  Medication Sig   amLODipine  (NORVASC ) 5 MG tablet Take 1 tablet (5 mg total) by mouth daily.   levothyroxine  (SYNTHROID ) 50 MCG tablet Take 1 tablet (50 mcg total) by mouth daily.   No current facility-administered medications on file prior to visit.   Medications  Discontinued During This Encounter  Medication Reason   amLODipine  (NORVASC ) 2.5 MG tablet      Physical Exam:    02/06/2024    8:05 AM 10/15/2023    8:10 AM 10/15/2023    8:03 AM  Vitals with BMI  Height 5' 11  5' 11  Weight 326 lbs 3 oz  329 lbs 13 oz  BMI 45.52  46.02  Systolic 138 142 853  Diastolic 88 90 90  Pulse 80  89  Vital signs reviewed.  Nursing notes reviewed. Weight trend reviewed.  Tape measure waist circumference at umbilicus of 53 Physical Activity: Insufficiently Active (02/03/2024)   Exercise Vital Sign    Days of Exercise per Week: 1 day    Minutes of Exercise per Session: 70 min   General Appearance:  No acute distress appreciable.   Well-groomed, healthy-appearing male.  Well proportioned with no abnormal fat distribution.  Good muscle tone. Pulmonary:  Normal work of breathing at rest, no respiratory distress apparent. SpO2: 94 %  Musculoskeletal: All extremities are intact.  Neurological:  Awake, alert, oriented, and engaged.  No obvious focal neurological deficits or cognitive impairments.  Sensorium seems unclouded.   Speech is clear and coherent with logical content. Psychiatric:  Appropriate mood, pleasant and cooperative demeanor, thoughtful and engaged during the exam  Lab on 12/17/2023  Component Date Value Ref Range Status   TSH W/REFLEX TO FT4 12/17/2023 6.62 (H)  0.40 - 4.50 mIU/L Final   Free T4 12/17/2023 0.61  0.60 - 1.60 ng/dL Final   TSH 92/84/7974 6.36 (H)  0.35 - 5.50 uIU/mL Final   Free T4 12/17/2023 0.9  0.8 - 1.8 ng/dL Final  Office Visit on 10/15/2023  Component Date Value Ref Range Status   T3 Uptake 10/15/2023 31  22 - 35 % Final   T4, Total 10/15/2023 4.0 (L)  4.9 - 10.5 mcg/dL Final   Free Thyroxine Index 10/15/2023 1.2 (L)  1.4 - 3.8 Final   TSH 10/15/2023 14.53 (H)  0.40 - 4.50 mIU/L Final   Anti-TPO Ab (RDL) 10/15/2023 1,639.1 (H)  <9.0 IU/mL Final  Office Visit on 08/27/2023  Component Date Value Ref Range Status   WBC  08/27/2023 6.1  4.0 - 10.5 K/uL Final   RBC 08/27/2023 5.10  4.22 - 5.81 Mil/uL Final   Hemoglobin 08/27/2023 16.5  13.0 - 17.0 g/dL Final   HCT 96/74/7974 49.7  39.0 - 52.0 % Final   MCV 08/27/2023 97.5  78.0 - 100.0 fl Final   MCHC 08/27/2023 33.1  30.0 - 36.0 g/dL Final   RDW 96/74/7974 13.2  11.5 - 15.5 % Final   Platelets 08/27/2023 179.0  150.0 - 400.0 K/uL Final   Neutrophils Relative % 08/27/2023 60.1  43.0 - 77.0 % Final   Lymphocytes Relative 08/27/2023 29.9  12.0 - 46.0 % Final   Monocytes Relative 08/27/2023 7.2  3.0 - 12.0 % Final   Eosinophils Relative 08/27/2023 2.3  0.0 - 5.0 % Final   Basophils Relative 08/27/2023 0.5  0.0 - 3.0 % Final   Neutro Abs 08/27/2023 3.7  1.4 - 7.7  K/uL Final   Lymphs Abs 08/27/2023 1.8  0.7 - 4.0 K/uL Final   Monocytes Absolute 08/27/2023 0.4  0.1 - 1.0 K/uL Final   Eosinophils Absolute 08/27/2023 0.1  0.0 - 0.7 K/uL Final   Basophils Absolute 08/27/2023 0.0  0.0 - 0.1 K/uL Final   Sodium 08/27/2023 138  135 - 145 mEq/L Final   Potassium 08/27/2023 4.0  3.5 - 5.1 mEq/L Final   Chloride 08/27/2023 101  96 - 112 mEq/L Final   CO2 08/27/2023 29  19 - 32 mEq/L Final   Glucose, Bld 08/27/2023 100 (H)  70 - 99 mg/dL Final   BUN 96/74/7974 13  6 - 23 mg/dL Final   Creatinine, Ser 08/27/2023 1.18  0.40 - 1.50 mg/dL Final   Total Bilirubin 08/27/2023 1.0  0.2 - 1.2 mg/dL Final   Alkaline Phosphatase 08/27/2023 65  39 - 117 U/L Final   AST 08/27/2023 19  0 - 37 U/L Final   ALT 08/27/2023 19  0 - 53 U/L Final   Total Protein 08/27/2023 6.8  6.0 - 8.3 g/dL Final   Albumin 96/74/7974 4.3  3.5 - 5.2 g/dL Final   GFR 96/74/7974 66.45  >60.00 mL/min Final   Calcium 08/27/2023 9.6  8.4 - 10.5 mg/dL Final   Cholesterol 96/74/7974 160  0 - 200 mg/dL Final   Triglycerides 96/74/7974 102.0  0.0 - 149.0 mg/dL Final   HDL 96/74/7974 41.90  >39.00 mg/dL Final   VLDL 96/74/7974 20.4  0.0 - 40.0 mg/dL Final   LDL Cholesterol 08/27/2023 98  0 - 99 mg/dL Final    Total CHOL/HDL Ratio 08/27/2023 4   Final   NonHDL 08/27/2023 118.56   Final   TSH 08/27/2023 7.96 (H)  0.35 - 5.50 uIU/mL Final   Hgb A1c MFr Bld 08/27/2023 5.2  4.6 - 6.5 % Final   PSA 08/27/2023 1.65  0.10 - 4.00 ng/mL Final  Lab on 08/03/2022  Component Date Value Ref Range Status   TSH 08/03/2022 2.82  0.35 - 5.50 uIU/mL Final   Cholesterol 08/03/2022 153  0 - 200 mg/dL Final   Triglycerides 96/98/7975 108.0  0.0 - 149.0 mg/dL Final   HDL 96/98/7975 37.20 (L)  >60.99 mg/dL Final   VLDL 96/98/7975 21.6  0.0 - 40.0 mg/dL Final   LDL Cholesterol 08/03/2022 94  0 - 99 mg/dL Final   Total CHOL/HDL Ratio 08/03/2022 4   Final   NonHDL 08/03/2022 115.64   Final   Hgb A1c MFr Bld 08/03/2022 5.1  4.6 - 6.5 % Final   Sodium 08/03/2022 140  135 - 145 mEq/L Final   Potassium 08/03/2022 3.7  3.5 - 5.1 mEq/L Final   Chloride 08/03/2022 103  96 - 112 mEq/L Final   CO2 08/03/2022 28  19 - 32 mEq/L Final   Glucose, Bld 08/03/2022 110 (H)  70 - 99 mg/dL Final   BUN 96/98/7975 13  6 - 23 mg/dL Final   Creatinine, Ser 08/03/2022 1.10  0.40 - 1.50 mg/dL Final   Total Bilirubin 08/03/2022 1.0  0.2 - 1.2 mg/dL Final   Alkaline Phosphatase 08/03/2022 63  39 - 117 U/L Final   AST 08/03/2022 13  0 - 37 U/L Final   ALT 08/03/2022 14  0 - 53 U/L Final   Total Protein 08/03/2022 6.8  6.0 - 8.3 g/dL Final   Albumin 96/98/7975 4.1  3.5 - 5.2 g/dL Final   GFR 96/98/7975 72.83  >60.00 mL/min Final   Calcium 08/03/2022  10.1  8.4 - 10.5 mg/dL Final   WBC 96/98/7975 7.2  4.0 - 10.5 K/uL Final   RBC 08/03/2022 5.08  4.22 - 5.81 Mil/uL Final   Platelets 08/03/2022 226.0  150.0 - 400.0 K/uL Final   Hemoglobin 08/03/2022 16.3  13.0 - 17.0 g/dL Final   HCT 96/98/7975 48.5  39.0 - 52.0 % Final   MCV 08/03/2022 95.4  78.0 - 100.0 fl Final   MCHC 08/03/2022 33.6  30.0 - 36.0 g/dL Final   RDW 96/98/7975 12.9  11.5 - 15.5 % Final  Office Visit on 07/26/2022  Component Date Value Ref Range Status   PSA 07/26/2022 1.54   0.10 - 4.00 ng/mL Final   HIV 1&2 Ab, 4th Generation 07/26/2022 NON-REACTIVE  NON-REACTIVE Final  No image results found. No results found.       ASSESSMENT & PLAN   Assessment & Plan Severe obesity (BMI >= 40) (HCC) He has morbid obesity with comorbidities including knee pain, sleep apnea, hyperlipidemia, secondary hypertension, and hypothyroidism. Weight loss strategies were discussed, focusing on tirzepatide  (Zepbound ), a GLP-1 receptor agonist, which may cause gastrointestinal side effects like nausea, diarrhea, decreased appetite, vomiting, constipation, dyspepsia, and abdominal pain. Emphasis was placed on lifestyle changes, such as resistance training and dietary modifications, to maintain muscle mass and enhance weight loss. Tirzepatide  offers potential for 20% weight loss in the first year, requiring a year-long commitment to treatment and lifestyle changes to prevent weight regain. Saxenda was mentioned as an alternative if tirzepatide  is not approved or affordable. A prescription for tirzepatide  will be submitted to the pharmacy, and the insurance prior authorization process will be initiated. Comorbidities and previous weight loss attempts will be documented to support insurance approval. Alternative weight loss medications like Saxenda will be discussed if necessary. Lifestyle modifications, including resistance training and dietary changes, will be encouraged to support weight loss. Hashimoto thyroiditis Primary hypothyroidism (Hashimoto's)   His primary hypothyroidism is managed with levothyroxine  50 mcg daily, with no reported symptoms. The current dose may be insufficient for optimal thyroid  function and weight management. The importance of taking levothyroxine  with water, on an empty stomach, and at least one hour apart from blood pressure medication was discussed to enhance absorption. Thyroid  function tests will be ordered to assess current thyroid  status. Levothyroxine  50 mcg  daily will be continued, with dosage adjustments based on test results. He is advised to take levothyroxine  with water, on an empty stomach, and at least one hour apart from blood pressure medication. Hypertension, unspecified type His hypertension presents with labile blood pressure readings, but current management appears adequate as blood pressure is well-managed today. Improvement in blood pressure is expected with weight loss and management of sleep apnea and hypothyroidism. The current antihypertensive regimen will be continued, and blood pressure will be monitored regularly. He is advised to take blood pressure medication at least one hour apart from levothyroxine .  ORDER ASSOCIATIONS  #   DIAGNOSIS / CONDITION ICD-10 ENCOUNTER ORDER     ICD-10-CM   1. Severe obesity (BMI >= 40) (HCC)  E66.01 tirzepatide  (ZEPBOUND ) 2.5 MG/0.5ML Pen    TSH + free T4    2. Hashimoto thyroiditis  E06.3     3. Hypertension, unspecified type  I10          Orders Placed in Encounter:   Lab Orders         TSH + free T4     Meds ordered this encounter  Medications   tirzepatide  (ZEPBOUND ) 2.5 MG/0.5ML  Pen    Sig: Inject 2.5 mg into the skin once a week.    Dispense:  2 mL    Refill:  11   Medical Decision Making: 2 or more stable chronic illnesses Prescription drug management       This document was synthesized by artificial intelligence ([employer]) using HIPAA-compliant recording of the clinical interaction;   We discussed the use of AI scribe software for clinical note transcription with the patient, who gave verbal consent to proceed. additional Info: This encounter employed state-of-the-art, real-time, collaborative documentation. The patient actively reviewed and assisted in updating their electronic medical record on a shared screen, ensuring transparency and facilitating joint problem-solving for the problem list, overview, and plan. This approach promotes accurate, informed care. The treatment  plan was discussed and reviewed in detail, including medication safety, potential side effects, and all patient questions. We confirmed understanding and comfort with the plan. Follow-up instructions were established, including contacting the office for any concerns, returning if symptoms worsen, persist, or new symptoms develop, and precautions for potential emergency department visits.

## 2024-02-06 NOTE — Assessment & Plan Note (Signed)
 Primary hypothyroidism (Hashimoto's)   His primary hypothyroidism is managed with levothyroxine  50 mcg daily, with no reported symptoms. The current dose may be insufficient for optimal thyroid  function and weight management. The importance of taking levothyroxine  with water, on an empty stomach, and at least one hour apart from blood pressure medication was discussed to enhance absorption. Thyroid  function tests will be ordered to assess current thyroid  status. Levothyroxine  50 mcg daily will be continued, with dosage adjustments based on test results. He is advised to take levothyroxine  with water, on an empty stomach, and at least one hour apart from blood pressure medication.

## 2024-02-06 NOTE — Assessment & Plan Note (Signed)
 He has morbid obesity with comorbidities including knee pain, sleep apnea, hyperlipidemia, secondary hypertension, and hypothyroidism. Weight loss strategies were discussed, focusing on tirzepatide  (Zepbound ), a GLP-1 receptor agonist, which may cause gastrointestinal side effects like nausea, diarrhea, decreased appetite, vomiting, constipation, dyspepsia, and abdominal pain. Emphasis was placed on lifestyle changes, such as resistance training and dietary modifications, to maintain muscle mass and enhance weight loss. Tirzepatide  offers potential for 20% weight loss in the first year, requiring a year-long commitment to treatment and lifestyle changes to prevent weight regain. Saxenda was mentioned as an alternative if tirzepatide  is not approved or affordable. A prescription for tirzepatide  will be submitted to the pharmacy, and the insurance prior authorization process will be initiated. Comorbidities and previous weight loss attempts will be documented to support insurance approval. Alternative weight loss medications like Saxenda will be discussed if necessary. Lifestyle modifications, including resistance training and dietary changes, will be encouraged to support weight loss.

## 2024-02-06 NOTE — Patient Instructions (Signed)
 It was a pleasure seeing you today! Your health and satisfaction are our top priorities.  Bernardino Cone, MD  VISIT SUMMARY: Today, you visited to discuss weight management and evaluate your thyroid  condition. We reviewed your history of obesity, sleep apnea, and hypothyroidism, and discussed potential weight loss medications and lifestyle changes.  YOUR PLAN: -MORBID OBESITY: Morbid obesity is a condition where excess body fat negatively affects your health. We discussed using tirzepatide  (Zepbound ) to help with weight loss, which may cause gastrointestinal side effects. You will need to commit to a year-long treatment and lifestyle changes, including resistance training and dietary modifications. If tirzepatide  is not approved or affordable, we may consider Saxenda. A prescription for tirzepatide  will be submitted, and we will start the insurance approval process.  -PRIMARY HYPOTHYROIDISM (HASHIMOTO'S): Primary hypothyroidism is when your thyroid  gland does not produce enough hormones, which can affect your metabolism. You are currently taking levothyroxine  50 mcg daily. We will continue this dose and order thyroid  function tests to see if any adjustments are needed. Remember to take your medication with water, on an empty stomach, and at least one hour apart from your blood pressure medication.  -HYPERTENSION: Hypertension is high blood pressure, which can lead to serious health problems if not managed. Your blood pressure is currently well-managed, and we expect it to improve further with weight loss and better management of your sleep apnea and hypothyroidism. Continue your current blood pressure medication and monitor your blood pressure regularly. Take your blood pressure medication at least one hour apart from your thyroid  medication.  INSTRUCTIONS: We will submit a prescription for tirzepatide  and start the insurance approval process. Please continue taking your current medications as directed  and follow the lifestyle changes we discussed. We will order thyroid  function tests to assess your current thyroid  status. Monitor your blood pressure regularly and take your medications as instructed.  Your Providers PCP: Cone Bernardino MATSU, MD,  [phone]) Referring Provider: Cone Bernardino MATSU, MD,  [phone]) Care Team Provider: Buck Saucer, MD,  [phone])  NEXT STEPS: [x]  Early Intervention: Schedule sooner appointment, call our on-call services, or go to emergency room if there is any significant Increase in pain or discomfort New or worsening symptoms Sudden or severe changes in your health [x]  Flexible Follow-Up: We recommend a No follow-ups on file. for optimal routine care. This allows for progress monitoring and treatment adjustments. [x]  Preventive Care: Schedule your annual preventive care visit! It's typically covered by insurance and helps identify potential health issues early. [x]  Lab & X-ray Appointments: Incomplete tests scheduled today, or call to schedule. X-rays: [facility] (M-F, 8:30am-noon or 1pm-5pm). [x]  Medical Information Release: Sign a release form at front desk to obtain relevant medical information we don't have.  MAKING THE MOST OF OUR FOCUSED 20 MINUTE APPOINTMENTS: [x]   Clearly state your top concerns at the beginning of the visit to focus our discussion [x]   If you anticipate you will need more time, please inform the front desk during scheduling - we can book multiple appointments in the same week. [x]   If you have transportation problems- use our convenient video appointments or ask about transportation support. [x]   We can get down to business faster if you use MyChart to update information before the visit and submit non-urgent questions before your visit. Thank you for taking the time to provide details through MyChart.  Let our nurse know and she can import this information into your encounter documents.  Arrival and Wait  Times: [  x]  Arriving on time ensures that everyone receives prompt attention. [x]   Early morning (8a) and afternoon (1p) appointments tend to have shortest wait times. [x]   Unfortunately, we cannot delay appointments for late arrivals or hold slots during phone calls.  Getting Answers and Following Up [x]   Simple Questions & Concerns: For quick questions or basic follow-up after your visit, reach us  at [phone] or MyChart messaging. [x]   Complex Concerns: If your concern is more complex, scheduling an appointment might be best. Discuss this with the staff to find the most suitable option. [x]   Lab & Imaging Results: We'll contact you directly if results are abnormal or you don't use MyChart. Most normal results will be on MyChart within 2-3 business days, with a review message from Dr. Jesus. Haven't heard back in 2 weeks? Need results sooner? Contact us  at [phone]. [x]   Referrals: Our referral coordinator will manage specialist referrals. The specialist's office should contact you within 2 weeks to schedule an appointment. Call us  if you haven't heard from them after 2 weeks.  Staying Connected [x]   MyChart: Activate your MyChart for the fastest way to access results and message us . See the last page of this paperwork for instructions on how to activate.  Bring to Your Next Appointment [x]   Medications: Please bring all your medication bottles to your next appointment to ensure we have an accurate record of your prescriptions. [x]   Health Diaries: If you're monitoring any health conditions at home, keeping a diary of your readings can be very helpful for discussions at your next appointment.  Billing [x]   X-ray & Lab Orders: These are billed by separate companies. Contact the invoicing company directly for questions or concerns. [x]   Visit Charges: Discuss any billing inquiries with our administrative services team.  Your Satisfaction Matters [x]   Share Your Experience: We  strive for your satisfaction! If you have any complaints, or preferably compliments, please let Dr. Jesus know directly or contact our Practice Administrators, Manuelita Rubin or [employer], by asking at the front desk.   Reviewing Your Records [x]   Review this early draft of your clinical encounter notes below and the final encounter summary tomorrow on MyChart after its been completed.  All orders placed so far are visible here: Severe obesity (BMI >= 40) (HCC) -     Tirzepatide -Weight Management; Inject 2.5 mg into the skin once a week.  Dispense: 2 mL; Refill: 11 -     TSH + free T4  Hashimoto thyroiditis  Hypertension, unspecified type

## 2024-02-07 LAB — TSH+FREE T4: TSH W/REFLEX TO FT4: 4.15 m[IU]/L (ref 0.40–4.50)

## 2024-02-09 ENCOUNTER — Ambulatory Visit: Payer: Self-pay | Admitting: Internal Medicine

## 2024-02-09 NOTE — Progress Notes (Signed)
 Facilitate the lab retest orders and visit scheduling

## 2024-02-10 ENCOUNTER — Other Ambulatory Visit: Payer: Self-pay

## 2024-02-10 DIAGNOSIS — E039 Hypothyroidism, unspecified: Secondary | ICD-10-CM

## 2024-02-10 DIAGNOSIS — E038 Other specified hypothyroidism: Secondary | ICD-10-CM

## 2024-03-17 ENCOUNTER — Encounter: Payer: Self-pay | Admitting: Internal Medicine

## 2024-08-28 ENCOUNTER — Encounter: Admitting: Internal Medicine

## 2024-09-01 ENCOUNTER — Encounter: Admitting: Internal Medicine
